# Patient Record
Sex: Male | Born: 1990 | Race: Black or African American | Hispanic: No | Marital: Single | Smoking: Never smoker
Health system: Southern US, Community
[De-identification: ages and names within clinical notes are randomized; demographics above are authoritative.]

## PROBLEM LIST (undated history)

## (undated) DIAGNOSIS — R569 Unspecified convulsions: Secondary | ICD-10-CM

## (undated) DIAGNOSIS — K219 Gastro-esophageal reflux disease without esophagitis: Secondary | ICD-10-CM

## (undated) DIAGNOSIS — E119 Type 2 diabetes mellitus without complications: Secondary | ICD-10-CM

## (undated) HISTORY — PX: NO PAST SURGERIES: SHX2092

---
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
## 2016-04-03 DIAGNOSIS — R569 Unspecified convulsions: Secondary | ICD-10-CM

## 2016-04-03 HISTORY — DX: Unspecified convulsions: R56.9

## 2016-04-24 ENCOUNTER — Emergency Department (HOSPITAL_COMMUNITY): Payer: Managed Care, Other (non HMO)

## 2016-04-24 ENCOUNTER — Encounter (HOSPITAL_COMMUNITY): Payer: Self-pay

## 2016-04-24 ENCOUNTER — Inpatient Hospital Stay (HOSPITAL_COMMUNITY)
Admission: EM | Admit: 2016-04-24 | Discharge: 2016-04-26 | DRG: 638 | Disposition: A | Discharge status: Home / Self Care | Payer: Managed Care, Other (non HMO) | Attending: Internal Medicine | Admitting: Internal Medicine

## 2016-04-24 DIAGNOSIS — Z6841 Body Mass Index (BMI) 40.0 and over, adult: Secondary | ICD-10-CM | POA: Diagnosis not present

## 2016-04-24 DIAGNOSIS — R569 Unspecified convulsions: Secondary | ICD-10-CM | POA: Diagnosis present

## 2016-04-24 DIAGNOSIS — H538 Other visual disturbances: Secondary | ICD-10-CM | POA: Diagnosis present

## 2016-04-24 DIAGNOSIS — I10 Essential (primary) hypertension: Secondary | ICD-10-CM | POA: Diagnosis present

## 2016-04-24 DIAGNOSIS — E1310 Other specified diabetes mellitus with ketoacidosis without coma: Secondary | ICD-10-CM | POA: Diagnosis not present

## 2016-04-24 DIAGNOSIS — E1110 Type 2 diabetes mellitus with ketoacidosis without coma: Principal | ICD-10-CM | POA: Diagnosis present

## 2016-04-24 DIAGNOSIS — R42 Dizziness and giddiness: Secondary | ICD-10-CM | POA: Diagnosis present

## 2016-04-24 DIAGNOSIS — R7989 Other specified abnormal findings of blood chemistry: Secondary | ICD-10-CM | POA: Diagnosis present

## 2016-04-24 DIAGNOSIS — Z833 Family history of diabetes mellitus: Secondary | ICD-10-CM

## 2016-04-24 DIAGNOSIS — E1010 Type 1 diabetes mellitus with ketoacidosis without coma: Secondary | ICD-10-CM | POA: Diagnosis not present

## 2016-04-24 DIAGNOSIS — Z951 Presence of aortocoronary bypass graft: Secondary | ICD-10-CM | POA: Diagnosis not present

## 2016-04-24 DIAGNOSIS — Z8249 Family history of ischemic heart disease and other diseases of the circulatory system: Secondary | ICD-10-CM

## 2016-04-24 DIAGNOSIS — E870 Hyperosmolality and hypernatremia: Secondary | ICD-10-CM | POA: Diagnosis present

## 2016-04-24 HISTORY — DX: Unspecified convulsions: R56.9

## 2016-04-24 HISTORY — DX: Type 2 diabetes mellitus without complications: E11.9

## 2016-04-24 LAB — BASIC METABOLIC PANEL
Anion gap: 15 (ref 5–15)
Anion gap: 10 (ref 5–15)
BUN: 13 mg/dL (ref 6–20)
BUN: 13 mg/dL (ref 6–20)
CALCIUM: 9.5 mg/dL (ref 8.9–10.3)
CHLORIDE: 109 mmol/L (ref 101–111)
CO2: 19 mmol/L — AB (ref 22–32)
CO2: 22 mmol/L (ref 22–32)
CREATININE: 1.57 mg/dL — AB (ref 0.61–1.24)
Calcium: 8.9 mg/dL (ref 8.9–10.3)
Chloride: 100 mmol/L — ABNORMAL LOW (ref 101–111)
Creatinine, Ser: 1.14 mg/dL (ref 0.61–1.24)
GFR calc Af Amer: >60 mL/min (ref >60)
GFR calc Af Amer: >60 mL/min (ref >60)
GFR calc non Af Amer: >60 mL/min (ref >60)
GFR calc non Af Amer: >60 mL/min (ref >60)
GLUCOSE: 739 mg/dL — AB (ref 65–99)
Glucose, Bld: 308 mg/dL — ABNORMAL HIGH (ref 65–99)
POTASSIUM: 4 mmol/L (ref 3.5–5.1)
Potassium: 4.2 mmol/L (ref 3.5–5.1)
SODIUM: 141 mmol/L (ref 135–145)
Sodium: 134 mmol/L — ABNORMAL LOW (ref 135–145)

## 2016-04-24 LAB — I-STAT VENOUS BLOOD GAS, ED
Acid-base deficit: 7 mmol/L — ABNORMAL HIGH (ref 0.0–2.0)
Bicarbonate: 20.8 mmol/L (ref 20.0–28.0)
O2 SAT: 74 %
PCO2 VEN: 47.3 mmHg (ref 44.0–60.0)
PO2 VEN: 45 mmHg (ref 32.0–45.0)
TCO2: 22 mmol/L (ref 0–100)
pH, Ven: 7.251 (ref 7.250–7.430)

## 2016-04-24 LAB — URINALYSIS, ROUTINE W REFLEX MICROSCOPIC
BACTERIA UA: NONE SEEN
Bilirubin Urine: NEGATIVE
Glucose, UA: >=500 mg/dL — AB
Ketones, ur: 20 mg/dL — AB
Leukocytes, UA: NEGATIVE
Nitrite: NEGATIVE
PH: 5 (ref 5.0–8.0)
Protein, ur: NEGATIVE mg/dL
Specific Gravity, Urine: 1.028 (ref 1.005–1.030)

## 2016-04-24 LAB — CBC
HCT: 44.2 % (ref 39.0–52.0)
HCT: 39.8 % (ref 39.0–52.0)
HEMOGLOBIN: 15.4 g/dL (ref 13.0–17.0)
HEMOGLOBIN: 13.8 g/dL (ref 13.0–17.0)
MCH: 28.7 pg (ref 26.0–34.0)
MCH: 28.3 pg (ref 26.0–34.0)
MCHC: 34.8 g/dL (ref 30.0–36.0)
MCHC: 34.7 g/dL (ref 30.0–36.0)
MCV: 82.3 fL (ref 78.0–100.0)
MCV: 81.6 fL (ref 78.0–100.0)
PLATELETS: 204 10*3/uL (ref 150–400)
PLATELETS: 204 10*3/uL (ref 150–400)
RBC: 5.37 MIL/uL (ref 4.22–5.81)
RBC: 4.88 MIL/uL (ref 4.22–5.81)
RDW: 13.2 % (ref 11.5–15.5)
RDW: 13.3 % (ref 11.5–15.5)
WBC: 8.9 10*3/uL (ref 4.0–10.5)
WBC: 11.7 10*3/uL — ABNORMAL HIGH (ref 4.0–10.5)

## 2016-04-24 LAB — MAGNESIUM: MAGNESIUM: 2.3 mg/dL (ref 1.7–2.4)

## 2016-04-24 LAB — CBG MONITORING, ED
Glucose-Capillary: >600 mg/dL (ref 65–99)
Glucose-Capillary: 560 mg/dL (ref 65–99)

## 2016-04-24 LAB — GLUCOSE, CAPILLARY
GLUCOSE-CAPILLARY: 470 mg/dL — AB (ref 65–99)
Glucose-Capillary: 248 mg/dL — ABNORMAL HIGH (ref 65–99)
Glucose-Capillary: 243 mg/dL — ABNORMAL HIGH (ref 65–99)

## 2016-04-24 LAB — MRSA PCR SCREENING: MRSA BY PCR: NEGATIVE

## 2016-04-24 LAB — PHOSPHORUS: PHOSPHORUS: 3.1 mg/dL (ref 2.5–4.6)

## 2016-04-24 LAB — BETA-HYDROXYBUTYRIC ACID: Beta-Hydroxybutyric Acid: 1.59 mmol/L — ABNORMAL HIGH (ref 0.05–0.27)

## 2016-04-24 MED ORDER — SODIUM CHLORIDE 0.9 % IV BOLUS (SEPSIS)
1000.0000 mL | Freq: Once | INTRAVENOUS | Status: AC
  Administered 2016-04-24: 1000 mL via INTRAVENOUS

## 2016-04-24 MED ORDER — DEXTROSE-NACL 5-0.45 % IV SOLN
INTRAVENOUS | Status: DC
  Administered 2016-04-24 – 2016-04-25 (×2): via INTRAVENOUS

## 2016-04-24 MED ORDER — SODIUM CHLORIDE 0.9 % IV SOLN
INTRAVENOUS | Status: DC
  Filled 2016-04-24: qty 2.5

## 2016-04-24 MED ORDER — SODIUM CHLORIDE 0.9 % IV SOLN
INTRAVENOUS | Status: DC
  Administered 2016-04-24: 5.4 [IU]/h via INTRAVENOUS
  Administered 2016-04-24: 10 [IU]/h via INTRAVENOUS
  Filled 2016-04-24: qty 2.5

## 2016-04-24 MED ORDER — SODIUM CHLORIDE 0.9 % IV SOLN: INTRAVENOUS | Status: DC

## 2016-04-24 MED ORDER — SODIUM CHLORIDE 0.9 % IV SOLN
INTRAVENOUS | Status: AC
  Administered 2016-04-24: 22:00:00 via INTRAVENOUS

## 2016-04-24 MED ORDER — ENOXAPARIN SODIUM 40 MG/0.4ML ~~LOC~~ SOLN
40.0000 mg | SUBCUTANEOUS | Status: DC
  Administered 2016-04-24: 40 mg via SUBCUTANEOUS
  Filled 2016-04-24: qty 0.4

## 2016-04-24 MED ORDER — POTASSIUM CHLORIDE CRYS ER 20 MEQ PO TBCR
20.0000 meq | EXTENDED_RELEASE_TABLET | Freq: Two times a day (BID) | ORAL | Status: DC
  Administered 2016-04-24 – 2016-04-26 (×4): 20 meq via ORAL
  Filled 2016-04-24 (×4): qty 1

## 2016-04-24 MED ORDER — FAMOTIDINE 20 MG PO TABS
20.0000 mg | ORAL_TABLET | Freq: Two times a day (BID) | ORAL | Status: DC
  Administered 2016-04-24 – 2016-04-26 (×4): 20 mg via ORAL
  Filled 2016-04-24 (×4): qty 1

## 2016-04-24 NOTE — ED Provider Notes (Signed)
[facility]EMERGENCY DEPT Provider Note  CSN: [id number] Arrival date & time: 04/24/16  1657  History   Chief Complaint Chief Complaint  Patient presents with  . Seizures   HPI Tyler Morris is a 26 y.o. male with no past medical history presenting with 2 episodes of seizures earlier today. History is provided by the patient.   Patient states that he was playing XBOX with his friends this afternoon when he suddenly started seizing. Per the patient, he was having full body convulsions that lasted for a couple of minutes. EMS was called. En route to the hospital patient had another seizure that last for about a minute. He was postictal until arrive to the ED when he regained his normal mental status. Patient endorses frequent urination and polydipsia over the last week or so. Otherwise, no fevers or chills. No recent illnesses. No nausea or vomiting. No headache, weakness, or numbness.   HPI  History reviewed. No pertinent past medical history.  Patient Active Problem List   Diagnosis Date Noted  . DKA (diabetic ketoacidoses) (HCC) 04/24/2016    History reviewed. No pertinent surgical history.     Home Medications    Prior to Admission medications   Medication Sig Start Date End Date Taking? Authorizing Provider  loratadine (CLARITIN) 10 MG tablet Take 10 mg by mouth daily.   Yes Historical Provider, MD    Family History No family history on file.  Social History Social History  Substance Use Topics  . Smoking status: Never Smoker  . Smokeless tobacco: Never Used  . Alcohol use No     Allergies   Patient has no known allergies.   Review of Systems Review of Systems  Constitutional: Negative.   HENT: Negative.   Eyes: Negative.   Respiratory: Negative.   Cardiovascular: Negative.   Gastrointestinal: Negative.   Endocrine: Positive for polydipsia.  Genitourinary: Positive for frequency.  Musculoskeletal: Negative.   Skin: Negative.   Allergic/Immunologic:  Negative.   Neurological: Positive for seizures. Negative for weakness, numbness and headaches.  Hematological: Negative.   Psychiatric/Behavioral: Negative.      Physical Exam Updated Vital Signs BP (!) 146/90   Pulse (!) 114   Temp 98 F (36.7 C) (Oral)   Resp 20   Ht  (1.778 m)   Wt 136.1 kg   SpO2 96%   BMI 43.05 kg/m   Physical Exam  Constitutional: He is oriented to person, place, and time. He appears well-developed and well-nourished. No distress.  HENT:  Head: Normocephalic and atraumatic.  Eyes: EOM are normal. Pupils are equal, round, and reactive to light.  Neck: Normal range of motion. Neck supple.  Cardiovascular: Regular rhythm.  Tachycardia present.   Pulmonary/Chest: Effort normal and breath sounds normal. Tachypnea noted.  Abdominal: Soft. Bowel sounds are normal. He exhibits no distension. There is no tenderness.  Musculoskeletal: Normal range of motion.  Neurological: He is alert and oriented to person, place, and time. He has normal strength. No cranial nerve deficit or sensory deficit.  Skin: Skin is warm and dry.  Psychiatric: He has a normal mood and affect. His behavior is normal. Judgment and thought content normal.  Nursing note and vitals reviewed.    ED Treatments / Results  Labs (all labs ordered are listed, but only abnormal results are displayed) Labs Reviewed  BASIC METABOLIC PANEL - Abnormal; Notable for the following:       Result Value   Sodium 134 (*)    Chloride 100 (*)  CO2 19 (*)    Glucose, Bld 739 (*)    Creatinine, Ser 1.57 (*)    All other components within normal limits  URINALYSIS, ROUTINE W REFLEX MICROSCOPIC - Abnormal; Notable for the following:    Color, Urine COLORLESS (*)    Glucose, UA >=500 (*)    Hgb urine dipstick SMALL (*)    Ketones, ur 20 (*)    Squamous Epithelial / LPF 0-5 (*)    All other components within normal limits  CBG MONITORING, ED - Abnormal; Notable for the following:     Glucose-Capillary >600 (*)    All other components within normal limits  I-STAT VENOUS BLOOD GAS, ED - Abnormal; Notable for the following:    Acid-base deficit 7.0 (*)    All other components within normal limits  CBC  BLOOD GAS, VENOUS  BETA-HYDROXYBUTYRIC ACID    EKG  EKG Interpretation  Date/Time:  Sunday April 24 2016 17:45:35 EDT Ventricular Rate:  123 PR Interval:    QRS Duration: 91 QT Interval:  316 QTC Calculation: 452 R Axis:   94 Text Interpretation:  Sinus tachycardia Borderline right axis deviation Confirmed by ZAVITZ MD, JOSHUA [phone]) on 04/24/2016 5:57:07 PM       Radiology Ct Head Wo Contrast  Result Date: 04/24/2016 CLINICAL DATA:  Seizure today.  Slight headache. EXAM: CT HEAD WITHOUT CONTRAST TECHNIQUE: Contiguous axial images were obtained from the base of the skull through the vertex without intravenous contrast. COMPARISON:  None. FINDINGS: Brain: No evidence of acute infarction, hemorrhage, hydrocephalus, extra-axial collection or mass lesion/mass effect. Vascular: No hyperdense vessel or unexpected calcification. Skull: Normal. Negative for fracture or focal lesion. Sinuses/Orbits: Unremarkable. Other: None. IMPRESSION: Normal examination. Electronically Signed   By: Beckie Salts M.D.   On: 04/24/2016 17:48    Procedures Procedures (including critical care time)  Medications Ordered in ED Medications  sodium chloride 0.9 % bolus 1,000 mL (not administered)  insulin regular (NOVOLIN R,HUMULIN R) 250 Units in sodium chloride 0.9 % 250 mL (1 Units/mL) infusion (5.4 Units/hr Intravenous New Bag/Given 04/24/16 1828)  sodium chloride 0.9 % bolus 1,000 mL (1,000 mLs Intravenous New Bag/Given 04/24/16 1718)     Initial Impression / Assessment and Plan / ED Course  I have reviewed the triage vital signs and the nursing notes.  Pertinent labs & imaging results that were available during my care of the patient were reviewed by me and considered in my medical  decision making (see chart for details).     Patient is an obese 26 year old male with no past medical history presenting with new onset seizure in the setting of tachycardia, tachypnea, and hyperglycemia concerning for DKA. Will check basic labs, VBG, beta hydroxybutyrate, and head CT.  6:02 PM Basic labs notable for profound hyperglycemia (glucose 739), acidosis (bicarb 19), and elevated creatinine. UA with 20 ketones. Head CT negative. Will start IV insulin. Discussed with neurology (Dr. Roxy Manns) who did not feel like the patient needed further evaluation from a neurological standpoint.   6:48 PM Discussed with hospitalist who will admit for new onset DM, and mild DKA.   Final Clinical Impressions(s) / ED Diagnoses   Final diagnoses:  Seizure (HCC)  Diabetic ketoacidosis without coma associated with type 2 diabetes mellitus (HCC)  Seizures (HCC)   New Prescriptions New Prescriptions   No medications on file     Ardith Dark, MD 04/24/16 1849    Blane Ohara, MD 04/24/16 [phone]

## 2016-04-24 NOTE — ED Notes (Signed)
Patient transported to CT 

## 2016-04-24 NOTE — Plan of Care (Signed)
Problem: Education: Goal: Knowledge of disease or condition will improve Outcome: Progressing Talked to patient about basic information regarding hyperglycemia. Pt. and family were eager to learn and verbalized understanding.

## 2016-04-24 NOTE — ED Triage Notes (Signed)
Pt from home with ems c/o new onset seizures.Pt came home fome eating out with friends and friends state "he wasn't acting right" then started having a seizure that lasted about 3 mins.Upon EMS arrival the pt had another siezure that lasted about 1 min.  Pt was postictal for about per EMS. Pt became alert and oriented when pt arrived to the hospital. Pt hypertensive at 210/130 with no history of and CBG read "high"also with no history of. HR 120, on room air, A/ox4. NAd at this time.

## 2016-04-24 NOTE — ED Notes (Signed)
Attempted report x1. 

## 2016-04-24 NOTE — H&P (Signed)
History and Physical    Nishawn Schroth ZOX:[id number] DOB: 04-16-1990 DOA: 04/24/2016  PCP: No PCP Per Patient   Patient coming from: Home.  I have personally briefly reviewed patient's old medical records in [facility]  Chief Complaint: Seizure.  HPI: Tyler Morris is a 26 y.o. male without significant past medical history who is brought to the emergency department via EMS after having a seizure at home.  Per patient, earlier today he went to play Xbox with some friends. About 5 minutes later, he had a generalized seizure. His friends called EMS, who subsequently arrived and reported that, while he was being transported to the emergency department, he had another episode of seizures in the ambulance. There was no tongue bites, urinary or fecal incontinence.  Triage nurse reports that the patient was postictal for about 25 minutes. His initial blood pressure was 210/130 mmHg, heart rate 120 BPM and CBG reading as high. He complains of having a headache and taking loratadine earlier in the day. He also complains of occasional blurred vision, polyuria, polydipsia, dizziness and fatigue for the past 2 weeks. He mentions that he has been drinking a lot of water and occasionally sweet tea. He denies chest pain, palpitations, diaphoresis, PND, orthopnea or pitting edema of the lower extremities. She denies abdominal pain, nausea, emesis, diarrhea, constipation, melena or hematochezia. He denies dysuria or hematuria.   ED Course: The patient received 2000 and ML of normal saline bolus and was started on an insulin infusion. Neurology was consulted. His urine analysis showed mild hemoglobinuria and 20 mg/dL of ketones. WBC 8.9, hemoglobin 15.4 g/dL and platelets 811. Sodium 134, potassium 4.2, chloride 100, bicarbonate 19 mmol/L. His BUN is 13, creatinine 1.57 and glucose 739 mg/dL. A venous blood gas showed an acid base deficit of 7, the rest of the values are within normal limits. CT scan of the head  was within normal limits.  Review of Systems: As per HPI otherwise 10 point review of systems negative.    Past Medical History:  Diagnosis Date  . Diabetes mellitus without complication (HCC)   . Seizures (HCC)     History reviewed. No pertinent surgical history.   reports that he has never smoked. He has never used smokeless tobacco. He reports that he does not drink alcohol or use drugs.  No Known Allergies  Family History  Problem Relation Age of Onset  . Hypertension Mother   . Diabetes Maternal Grandfather   . Hypertension Maternal Grandfather   . Hypertension Maternal Grandmother   . Hyperlipidemia Maternal Grandmother     Prior to Admission medications   Medication Sig Start Date End Date Taking? Authorizing Provider  loratadine (CLARITIN) 10 MG tablet Take 10 mg by mouth daily.   Yes Historical Provider, MD    Physical Exam:  Constitutional: NAD, calm, comfortable Vitals:   04/24/16 1730 04/24/16 1836 04/24/16 1927 04/24/16 1930  BP: (!) 171/110 (!) 146/90 (!) 154/106 132/75  Pulse: (!) 122 (!) 114 (!) 112 (!) 109  Resp: (!) Temp:      TempSrc:      SpO2: 94% 96% 96% 97%  Weight:      Height:       Eyes: PERRL, lids and conjunctivae normal ENMT: Mucous membranes are mildly dry. Posterior pharynx clear of any exudate or lesions. Neck: normal, supple, no masses, no thyromegaly Respiratory: clear to auscultation bilaterally, no wheezing, no crackles. Normal respiratory effort. No accessory muscle use.  Cardiovascular:  Tachycardic at 109 BPM, no murmurs / rubs / gallops. No extremity edema. 2+ pedal pulses. No carotid bruits.  Abdomen: Obese, soft, no tenderness, no masses palpated. No hepatosplenomegaly. Bowel sounds positive.  Musculoskeletal: no clubbing / cyanosis.  Good ROM, no contractures. Normal muscle tone.  Skin: no rashes, lesions, ulcers. No induration Neurologic: CN 2-12 grossly intact. Sensation intact, DTR normal. Strength 5/5 in all  4.  Psychiatric: Normal judgment and insight. Alert and oriented x 3. Normal mood.    Labs on Admission: I have personally reviewed following labs and imaging studies  CBC:  Recent Labs Lab 04/24/16 1712  WBC 8.9  HGB 15.4  HCT 44.2  MCV 82.3  PLT 204   Basic Metabolic Panel:  Recent Labs Lab 04/24/16 1712  NA 134*  K 4.2  CL 100*  CO2 19*  GLUCOSE 739*  BUN 13  CREATININE 1.57*  CALCIUM 9.5   GFR: Estimated Creatinine Clearance (by C-G formula based on SCr of 1.57 mg/dL (H)) Male: 84 mL/min (A) Male: 101.6 mL/min (A) Liver Function Tests: No results for input(s): AST, ALT, ALKPHOS, BILITOT, PROT, ALBUMIN in the last 168 hours. No results for input(s): LIPASE, AMYLASE in the last 168 hours. No results for input(s): AMMONIA in the last 168 hours. Coagulation Profile: No results for input(s): INR, PROTIME in the last 168 hours. Cardiac Enzymes: No results for input(s): CKTOTAL, CKMB, CKMBINDEX, TROPONINI in the last 168 hours. BNP (last 3 results) No results for input(s): PROBNP in the last 8760 hours. HbA1C: No results for input(s): HGBA1C in the last 72 hours. CBG:  Recent Labs Lab 04/24/16 1712 04/24/16 1947  GLUCAP >600* 560*   Lipid Profile: No results for input(s): CHOL, HDL, LDLCALC, TRIG, CHOLHDL, LDLDIRECT in the last 72 hours. Thyroid Function Tests: No results for input(s): TSH, T4TOTAL, FREET4, T3FREE, THYROIDAB in the last 72 hours. Anemia Panel: No results for input(s): VITAMINB12, FOLATE, FERRITIN, TIBC, IRON, RETICCTPCT in the last 72 hours. Urine analysis:    Component Value Date/Time   COLORURINE COLORLESS (A) 04/24/2016 1754   APPEARANCEUR CLEAR 04/24/2016 1754   LABSPEC 1.028 04/24/2016 1754   PHURINE 5.0 04/24/2016 1754   GLUCOSEU >=500 (A) 04/24/2016 1754   HGBUR SMALL (A) 04/24/2016 1754   BILIRUBINUR NEGATIVE 04/24/2016 1754   KETONESUR 20 (A) 04/24/2016 1754   PROTEINUR NEGATIVE 04/24/2016 1754   NITRITE NEGATIVE  04/24/2016 1754   LEUKOCYTESUR NEGATIVE 04/24/2016 1754    Radiological Exams on Admission: Ct Head Wo Contrast  Result Date: 04/24/2016 CLINICAL DATA:  Seizure today.  Slight headache. EXAM: CT HEAD WITHOUT CONTRAST TECHNIQUE: Contiguous axial images were obtained from the base of the skull through the vertex without intravenous contrast. COMPARISON:  None. FINDINGS: Brain: No evidence of acute infarction, hemorrhage, hydrocephalus, extra-axial collection or mass lesion/mass effect. Vascular: No hyperdense vessel or unexpected calcification. Skull: Normal. Negative for fracture or focal lesion. Sinuses/Orbits: Unremarkable. Other: None. IMPRESSION: Normal examination. Electronically Signed   By: Beckie Salts M.D.   On: 04/24/2016 17:48    EKG: Independently reviewed. Vent. rate 123 BPM PR interval * ms QRS duration 91 ms QT/QTc 316/452 ms P-R-T axes 58 94 12 Sinus tachycardia Borderline right axis deviation  Assessment/Plan Principal Problem:   DKA (diabetic ketoacidoses) (HCC) Admit to stepdown/inpatient. Continue IV fluids. Continue insulin infusion. Monitor CBG hourly. Keep nothing by mouth for now. Monitor potassium, phosphorus and magnesium. Check hemoglobin A1c in a.m.  Active Problems:   Seizures (HCC) CT scan of the brain was  negative. Neuro checks every 4 hours. Further workup per neurology.    Elevated serum creatinine Continue IV fluids. Follow-up renal function and electrolytes.    DVT prophylaxis: Lovenox SQ. Code Status: Full code. Family Communication: His mother and sister were present in the emergency department. Disposition Plan: Admit for DKA treatment and neurology evaluation. Consults called: Neurology was consulted by the emergency department, consult report still pending. Admission status: Inpatient/stepdown.   Bobette Mo MD [facility] Pager [phone]  If 7PM-7AM, please contact night-coverage [email or web address] Password  [facility]  04/24/2016, 8:45 PM

## 2016-04-24 NOTE — ED Notes (Signed)
Pt returned from CT °

## 2016-04-25 DIAGNOSIS — E1310 Other specified diabetes mellitus with ketoacidosis without coma: Secondary | ICD-10-CM

## 2016-04-25 DIAGNOSIS — R7989 Other specified abnormal findings of blood chemistry: Secondary | ICD-10-CM

## 2016-04-25 LAB — BASIC METABOLIC PANEL
ANION GAP: 9 (ref 5–15)
Anion gap: 8 (ref 5–15)
Anion gap: 9 (ref 5–15)
BUN: 10 mg/dL (ref 6–20)
BUN: 11 mg/dL (ref 6–20)
BUN: 12 mg/dL (ref 6–20)
CHLORIDE: 110 mmol/L (ref 101–111)
CO2: 21 mmol/L — ABNORMAL LOW (ref 22–32)
CO2: 22 mmol/L (ref 22–32)
CO2: 19 mmol/L — AB (ref 22–32)
CREATININE: 1.05 mg/dL (ref 0.61–1.24)
Calcium: 8.5 mg/dL — ABNORMAL LOW (ref 8.9–10.3)
Calcium: 8.6 mg/dL — ABNORMAL LOW (ref 8.9–10.3)
Calcium: 8.5 mg/dL — ABNORMAL LOW (ref 8.9–10.3)
Chloride: 112 mmol/L — ABNORMAL HIGH (ref 101–111)
Chloride: 113 mmol/L — ABNORMAL HIGH (ref 101–111)
Creatinine, Ser: 0.97 mg/dL (ref 0.61–1.24)
Creatinine, Ser: 0.86 mg/dL (ref 0.61–1.24)
GFR calc Af Amer: >60 mL/min (ref >60)
GFR calc non Af Amer: >60 mL/min (ref >60)
GLUCOSE: 301 mg/dL — AB (ref 65–99)
Glucose, Bld: 241 mg/dL — ABNORMAL HIGH (ref 65–99)
Glucose, Bld: 150 mg/dL — ABNORMAL HIGH (ref 65–99)
POTASSIUM: 3.4 mmol/L — AB (ref 3.5–5.1)
POTASSIUM: 4 mmol/L (ref 3.5–5.1)
Potassium: 3.9 mmol/L (ref 3.5–5.1)
SODIUM: 138 mmol/L (ref 135–145)
SODIUM: 142 mmol/L (ref 135–145)
Sodium: 143 mmol/L (ref 135–145)

## 2016-04-25 LAB — GLUCOSE, CAPILLARY
GLUCOSE-CAPILLARY: 220 mg/dL — AB (ref 65–99)
GLUCOSE-CAPILLARY: 162 mg/dL — AB (ref 65–99)
GLUCOSE-CAPILLARY: 159 mg/dL — AB (ref 65–99)
GLUCOSE-CAPILLARY: 165 mg/dL — AB (ref 65–99)
GLUCOSE-CAPILLARY: 168 mg/dL — AB (ref 65–99)
Glucose-Capillary: 229 mg/dL — ABNORMAL HIGH (ref 65–99)
Glucose-Capillary: 173 mg/dL — ABNORMAL HIGH (ref 65–99)
Glucose-Capillary: 165 mg/dL — ABNORMAL HIGH (ref 65–99)
Glucose-Capillary: 294 mg/dL — ABNORMAL HIGH (ref 65–99)
Glucose-Capillary: 248 mg/dL — ABNORMAL HIGH (ref 65–99)
Glucose-Capillary: 247 mg/dL — ABNORMAL HIGH (ref 65–99)
Glucose-Capillary: 223 mg/dL — ABNORMAL HIGH (ref 65–99)

## 2016-04-25 LAB — CBC WITH DIFFERENTIAL/PLATELET
BASOS ABS: 0 10*3/uL (ref 0.0–0.1)
Basophils Relative: 0 %
EOS ABS: 0.1 10*3/uL (ref 0.0–0.7)
EOS PCT: 2 %
HCT: 37.1 % — ABNORMAL LOW (ref 39.0–52.0)
Hemoglobin: 12.5 g/dL — ABNORMAL LOW (ref 13.0–17.0)
LYMPHS PCT: 27 %
Lymphs Abs: 2.2 10*3/uL (ref 0.7–4.0)
MCH: 27.7 pg (ref 26.0–34.0)
MCHC: 33.7 g/dL (ref 30.0–36.0)
MCV: 82.1 fL (ref 78.0–100.0)
Monocytes Absolute: 0.6 10*3/uL (ref 0.1–1.0)
Monocytes Relative: 8 %
NEUTROS PCT: 63 %
Neutro Abs: 5.1 10*3/uL (ref 1.7–7.7)
PLATELETS: 171 10*3/uL (ref 150–400)
RBC: 4.52 MIL/uL (ref 4.22–5.81)
RDW: 13.4 % (ref 11.5–15.5)
WBC: 8 10*3/uL (ref 4.0–10.5)

## 2016-04-25 LAB — HEMOGLOBIN A1C
HEMOGLOBIN A1C: 11.2 % — AB (ref 4.8–5.6)
Mean Plasma Glucose: 275 mg/dL

## 2016-04-25 LAB — HIV ANTIBODY (ROUTINE TESTING W REFLEX): HIV SCREEN 4TH GENERATION: NONREACTIVE

## 2016-04-25 MED ORDER — GLIPIZIDE 5 MG PO TABS
5.0000 mg | ORAL_TABLET | Freq: Two times a day (BID) | ORAL | Status: DC
  Administered 2016-04-25 – 2016-04-26 (×2): 5 mg via ORAL
  Filled 2016-04-25 (×2): qty 1

## 2016-04-25 MED ORDER — SODIUM CHLORIDE 0.9 % IV SOLN
INTRAVENOUS | Status: DC
  Administered 2016-04-25: 09:00:00 via INTRAVENOUS

## 2016-04-25 MED ORDER — INSULIN GLARGINE 100 UNIT/ML ~~LOC~~ SOLN
5.0000 [IU] | Freq: Every day | SUBCUTANEOUS | Status: DC
  Administered 2016-04-25: 5 [IU] via SUBCUTANEOUS
  Filled 2016-04-25: qty 0.05

## 2016-04-25 MED ORDER — HYDROCHLOROTHIAZIDE 12.5 MG PO CAPS
12.5000 mg | ORAL_CAPSULE | Freq: Every day | ORAL | Status: DC
  Administered 2016-04-26: 12.5 mg via ORAL
  Filled 2016-04-25: qty 1

## 2016-04-25 MED ORDER — LISINOPRIL 5 MG PO TABS
5.0000 mg | ORAL_TABLET | Freq: Every day | ORAL | Status: DC
  Administered 2016-04-26: 5 mg via ORAL
  Filled 2016-04-25: qty 1

## 2016-04-25 MED ORDER — LIVING WELL WITH DIABETES BOOK
Freq: Once | Status: AC
  Administered 2016-04-25: 12:00:00
  Filled 2016-04-25: qty 1

## 2016-04-25 MED ORDER — INSULIN ASPART 100 UNIT/ML ~~LOC~~ SOLN
0.0000 [IU] | Freq: Three times a day (TID) | SUBCUTANEOUS | Status: DC
  Administered 2016-04-26: 3 [IU] via SUBCUTANEOUS
  Administered 2016-04-26: 5 [IU] via SUBCUTANEOUS
  Administered 2016-04-26: 8 [IU] via SUBCUTANEOUS

## 2016-04-25 MED ORDER — INSULIN ASPART 100 UNIT/ML ~~LOC~~ SOLN
0.0000 [IU] | Freq: Every day | SUBCUTANEOUS | Status: DC
  Administered 2016-04-25: 2 [IU] via SUBCUTANEOUS

## 2016-04-25 MED ORDER — INSULIN ASPART 100 UNIT/ML ~~LOC~~ SOLN: 0.0000 [IU] | Freq: Every day | SUBCUTANEOUS | Status: DC

## 2016-04-25 MED ORDER — INSULIN ASPART 100 UNIT/ML ~~LOC~~ SOLN
0.0000 [IU] | Freq: Three times a day (TID) | SUBCUTANEOUS | Status: DC
  Administered 2016-04-25: 5 [IU] via SUBCUTANEOUS
  Administered 2016-04-25: 3 [IU] via SUBCUTANEOUS

## 2016-04-25 MED ORDER — ENOXAPARIN SODIUM 80 MG/0.8ML ~~LOC~~ SOLN
80.0000 mg | SUBCUTANEOUS | Status: DC
  Administered 2016-04-25: 80 mg via SUBCUTANEOUS
  Filled 2016-04-25: qty 0.8

## 2016-04-25 MED ORDER — METFORMIN HCL 500 MG PO TABS
500.0000 mg | ORAL_TABLET | Freq: Two times a day (BID) | ORAL | Status: DC
  Administered 2016-04-25: 500 mg via ORAL
  Filled 2016-04-25: qty 1

## 2016-04-25 NOTE — Progress Notes (Signed)
Inpatient Diabetes Program Recommendations  AACE/ADA: New Consensus Statement on Inpatient Glycemic Control (2015)  Target Ranges:  Prepandial:   less than 140 mg/dL      Peak postprandial:   less than 180 mg/dL (1-2 hours)      Critically ill patients:  140 - 180 mg/dL   Results for JASN, XIA (MRN [id number]) as of 04/25/2016 15:40  Ref. Range 04/24/2016 17:12 04/24/2016 19:47 04/24/2016 21:04 04/24/2016 22:20 04/24/2016 23:26 04/25/2016 00:34 04/25/2016 01:38 04/25/2016 02:38 04/25/2016 03:50 04/25/2016 04:56 04/25/2016 05:55 04/25/2016 07:12 04/25/2016 08:18 04/25/2016 12:04  Glucose-Capillary Latest Ref Range: 65 - 99 mg/dL >621 (HH) 308 (HH) 657 (H) 248 (H) 243 (H) 229 (H) 220 (H) 162 (H) 159 (H) 165 (H) 168 (H) 173 (H) 165 (H) 294 (H)    Admit with: Seizures/ New diagnosis of DM  Current Insulin Orders: Lantus 5 units daily      Novolog Sensitive Correction Scale/ SSI (0-9 units) TID AC + HS      Spoke with pt and mother of pt about new diagnosis.  Hemoglobin A1C in process.  Explained what an A1C is, basic pathophysiology of DM Type 2, basic home care, basic diabetes diet nutrition principles, importance of checking CBGs and maintaining good CBG control to prevent long-term and short-term complications.  Reviewed signs and symptoms of hyperglycemia and hypoglycemia and how to treat hypoglycemia at home.  Also reviewed blood sugar goals and A1c goals for home.    RNs to provide ongoing basic DM education at bedside with this patient.  Have ordered educational booklet, CBG meter teaching, and DM videos.  Have also placed RD consult for DM diet education for this patient.  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  Does not get much exercise.  Stated he does drink a sweet tea at lunch but mostly drinks water.  Discussed basic DM diet information with patient.  Encouraged patient to avoid beverages with sugar (regular soda, sweet tea, lemonade, fruit juice) and to consume mostly  water.  Discussed what foods contain carbohydrates and how carbohydrates affect the body's blood sugar levels.  Encouraged patient to be careful with his portion sizes (especially grains, starchy vegetables, and fruits).      MD- Note we are currently waiting on pt's A1c results.  I am hopeful that pt will be able to be discharged home on an oral medication regimen along with nutritional/lifestyle modifications to help him manage his diabetes.  I am not sure pt can still drive a truck for his job if he is taking insulin.  At the very least, pt would likely benefit from starting Metformin at home.     --Will follow patient during hospitalization--  Ambrose Finland RN, MSN, CDE Diabetes Coordinator Inpatient Glycemic Control Team Team Pager: [phone] (8a-5p)

## 2016-04-25 NOTE — Plan of Care (Signed)
Problem: Nutrition: Goal: Adequate nutrition will be maintained Outcome: Progressing Discussed with the pt. The importance of eating healthy, low carb foods to keep blood sugar within range.

## 2016-04-25 NOTE — Care Management Note (Addendum)
Case Management Note  Patient Details  Name: Abass Misener MRN: [id number] Date of Birth: 25-Jun-1990  Subjective/Objective:  From home , presents with Seizures secondary to DKA, patient states he has insurance with [employer] and he has medication coverage, he does not have a pcp, he would like to follow with a  MD, will check into tomorrow.  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   His subscriber number is [id number], group number [id number], BIN [id number], PCN [id number].                  Action/Plan: NCM will follow for dc needs.   Expected Discharge Date:                  Expected Discharge Plan:  Home/Self Care  In-House Referral:     Discharge planning Services  CM Consult, Medication Assistance, Indigent Health Clinic  Post Acute Care Choice:    Choice offered to:     DME Arranged:    DME Agency:     HH Arranged:    HH Agency:     Status of Service:  In process, will continue to follow  If discussed at Long Length of Stay Meetings, dates discussed:    Additional Comments:  Leone Haven, RN 04/25/2016, 5:54 PM

## 2016-04-25 NOTE — Progress Notes (Signed)
[place] TEAM 1 - Stepdown/ICU TEAM  Craig Drucie Opitz  UJW:[id number] DOB: 01-06-1990 DOA: 04/24/2016 PCP: No PCP Per Patient    Brief Narrative:  26 y.o. male without significant past medical history who was brought to the emergency department via EMS after having a generalized seizure at home.  His friends called EMS, who subsequently arrived and reported that while he was being transported he had another episode of seizures in the ambulance. There was no tongue bites, urinary or fecal incontinence.  He complained of occasional blurred vision, polyuria, polydipsia, dizziness and fatigue for 2 weeks. His serum glucose was >700 upon arrival.    Subjective: The patient is resting comfortably in bed.  He denies chest pain shortness breath fevers chills nausea vomiting or abdominal pain.  Assessment & Plan:  Early DKA in newly diagnosed diabetes Now off insulin gtt, but CBG remains quite variable and is presently climbing despite low dose lantus given at transition - attempting to avoid home insulin but may be required - begin glucophage and glucotrol tonight - cont SSI - home when education completed and CBG consistently <200 - extensive discussion on diagnosis of DM w/ pt and his mother at bedside   Seizure Likely due to hyperosmolar state - will not start medical tx at this time but follow - if recurrs will require evaluation - CT brain unrevealing   Elevated BP May or may not have HTN, but likely does - will initiate HCTZ and ACEi given his DM, and advised him that outpt f/u of his BP when he is not acutely ill will be necessary before a formal diagnosis of HTN is made  Elevated serum creatinine Due to DKA/pre-renal state - resolved w/ volume expansion   Recent Labs Lab 04/24/16 1712 04/24/16 2156 04/25/16 0030 04/25/16 0457 04/25/16 1220  CREATININE 1.57* 1.14 0.97 0.86 1.05    Morbid obesity - Body mass index is 54.53 kg/m. Counseled patient only between obesity and his diabetes  and the fact that his CBG will be much easier to control if he loses weight and that his diabetes may not require medication if he reaches his target BMI  DVT prophylaxis: lovenox  Code Status: FULL CODE Family Communication: Spoke with patient and mother at bedside at length Disposition Plan: Transfer to medical bed - continue extensive diabetes education - follow CBGs - discharge home when education completed and CBG consistently <200  Consultants:  None  Procedures: none  Antimicrobials:  none   Objective: Blood pressure (!) 153/88, pulse 90, temperature 98.3 F (36.8 C), temperature source Oral, resp. rate 15, height  (1.753 m), weight (!) 167.5 kg (369 lb 4.3 oz), SpO2 96 %.  Intake/Output Summary (Last 24 hours) at 04/25/16 1614 Last data filed at 04/25/16 1500  Gross per 24 hour  Intake          5308.29 ml  Output             3600 ml  Net          1708.29 ml   Filed Weights   04/24/16 1703 04/24/16 2047  Weight: 136.1 kg (300 lb) (!) 167.5 kg (369 lb 4.3 oz)    Examination: General: No acute respiratory distress Lungs: Clear to auscultation bilaterally without wheezes or crackles Cardiovascular: Regular rate and rhythm without murmur gallop or rub normal S1 and S2 Abdomen: Morbidly obese, soft, bowel sounds positive, no rebound Extremities: No significant cyanosis, clubbing, or edema bilateral lower extremities  CBC:  Recent Labs  Lab 04/24/16 1712 04/24/16 2156 04/25/16 0457  WBC 8.9 11.7* 8.0  NEUTROABS  --   --  5.1  HGB 15.4 13.8 12.5*  HCT 44.2 39.8 37.1*  MCV 82.3 81.6 82.1  PLT 204 204 171   Basic Metabolic Panel:  Recent Labs Lab 04/24/16 1712 04/24/16 2156 04/25/16 0030 04/25/16 0457 04/25/16 1220  NA 134* 141 142 143 138  K 4.2 4.0 4.0 3.4* 3.9  CL 100* 109 112* 113* 110  CO2 19* 22 21* 22 19*  GLUCOSE 739* 308* 241* 150* 301*  BUN CREATININE 1.57* 1.14 0.97 0.86 1.05  CALCIUM 9.5 8.9 8.5* 8.6* 8.5*  MG  --   2.3  --   --   --   PHOS  --  3.1  --   --   --    GFR: Estimated Creatinine Clearance: 169.3 mL/min (by C-G formula based on SCr of 1.05 mg/dL).  HbA1C: No results found for: HGBA1C  CBG:  Recent Labs Lab 04/25/16 0456 04/25/16 0555 04/25/16 0712 04/25/16 0818 04/25/16 1204  GLUCAP 165* 168* 173* 165* 294*    Recent Results (from the past 240 hour(s))  MRSA PCR Screening     Status: None   Collection Time: 04/24/16  8:42 PM  Result Value Ref Range Status   MRSA by PCR NEGATIVE NEGATIVE Final    Comment:        The GeneXpert MRSA Assay (FDA approved for NASAL specimens only), is one component of a comprehensive MRSA colonization surveillance program. It is not intended to diagnose MRSA infection nor to guide or monitor treatment for MRSA infections.      Scheduled Meds: . enoxaparin (LOVENOX) injection  40 mg Subcutaneous Q24H  . famotidine  20 mg Oral BID  . insulin aspart  0-5 Units Subcutaneous QHS  . insulin aspart  0-9 Units Subcutaneous TID WC  . insulin glargine  5 Units Subcutaneous Daily  . potassium chloride  20 mEq Oral BID   Continuous Infusions: . sodium chloride 50 mL/hr at 04/25/16 0902     LOS: 1 day   Lonia Blood, MD [facility] Office  [phone] Pager - Text Page per Loretha Stapler as per below:  On-Call/Text Page:      [email or web address]      password [email or web address]  If 7PM-7AM, please contact night-coverage [email or web address] Password [facility] 04/25/2016, 4:14 PM

## 2016-04-25 NOTE — Progress Notes (Signed)
Nutrition Education Note  RD consulted for nutrition education regarding diabetes.   RD provided "Nutrition Therapy for Type II Diabetes" handout from the Academy of Nutrition and Dietetics. Discussed different food groups and their effects on blood sugar, emphasizing carbohydrate-containing foods. Provided list of carbohydrates and recommended serving sizes of common foods.  Discussed importance of controlled and consistent carbohydrate intake throughout the day. Provided examples of ways to balance meals/snacks and encouraged intake of high-fiber, whole grain complex carbohydrates. Teach back method used.  Expect good compliance.  Body mass index is 54.53 kg/m. Pt meets criteria for morbid obese based on current BMI.  Current diet order is carbohydrate controlled, patient is consuming approximately 100% of meals at this time. Labs and medications reviewed. No further nutrition interventions warranted at this time. RD contact information provided. If additional nutrition issues arise, please re-consult RD.  Tyler Morris, RD, LDN Pager #- [phone]

## 2016-04-25 NOTE — Plan of Care (Signed)
Problem: Education: Goal: Knowledge of disease or condition will improve Outcome: Progressing Patient given/educated on contents of living with diabetes booklet. Patient watching diabetes videos. Family at bedside educated as well.

## 2016-04-26 ENCOUNTER — Inpatient Hospital Stay (HOSPITAL_COMMUNITY): Payer: Managed Care, Other (non HMO)

## 2016-04-26 DIAGNOSIS — R569 Unspecified convulsions: Secondary | ICD-10-CM

## 2016-04-26 LAB — RAPID URINE DRUG SCREEN, HOSP PERFORMED
Amphetamines: NOT DETECTED
Barbiturates: NOT DETECTED
Benzodiazepines: NOT DETECTED
COCAINE: NOT DETECTED
OPIATES: NOT DETECTED
TETRAHYDROCANNABINOL: NOT DETECTED

## 2016-04-26 LAB — BASIC METABOLIC PANEL
Anion gap: 9 (ref 5–15)
BUN: 9 mg/dL (ref 6–20)
CHLORIDE: 103 mmol/L (ref 101–111)
CO2: 22 mmol/L (ref 22–32)
CREATININE: 0.84 mg/dL (ref 0.61–1.24)
Calcium: 9.4 mg/dL (ref 8.9–10.3)
GFR calc non Af Amer: >60 mL/min (ref >60)
Glucose, Bld: 241 mg/dL — ABNORMAL HIGH (ref 65–99)
POTASSIUM: 3.6 mmol/L (ref 3.5–5.1)
Sodium: 134 mmol/L — ABNORMAL LOW (ref 135–145)

## 2016-04-26 LAB — GLUCOSE, CAPILLARY
GLUCOSE-CAPILLARY: 195 mg/dL — AB (ref 65–99)
GLUCOSE-CAPILLARY: 209 mg/dL — AB (ref 65–99)
Glucose-Capillary: 256 mg/dL — ABNORMAL HIGH (ref 65–99)

## 2016-04-26 MED ORDER — LISINOPRIL 5 MG PO TABS: 5.0000 mg | ORAL_TABLET | Freq: Every day | ORAL | 0 refills | Status: DC

## 2016-04-26 MED ORDER — BLOOD GLUCOSE METER KIT: PACK | 0 refills | Status: DC

## 2016-04-26 MED ORDER — GLIPIZIDE 5 MG PO TABS
10.0000 mg | ORAL_TABLET | Freq: Two times a day (BID) | ORAL | Status: DC
  Administered 2016-04-26: 10 mg via ORAL
  Filled 2016-04-26: qty 2

## 2016-04-26 MED ORDER — METFORMIN HCL 1000 MG PO TABS: 1000.0000 mg | ORAL_TABLET | Freq: Two times a day (BID) | ORAL | 0 refills | Status: DC

## 2016-04-26 MED ORDER — GLIPIZIDE 10 MG PO TABS: 10.0000 mg | ORAL_TABLET | Freq: Two times a day (BID) | ORAL | 0 refills | Status: DC

## 2016-04-26 MED ORDER — METFORMIN HCL 500 MG PO TABS
1000.0000 mg | ORAL_TABLET | Freq: Two times a day (BID) | ORAL | Status: DC
  Administered 2016-04-26 (×2): 1000 mg via ORAL
  Filled 2016-04-26 (×2): qty 2

## 2016-04-26 MED ORDER — HYDROCHLOROTHIAZIDE 12.5 MG PO CAPS: 12.5000 mg | ORAL_CAPSULE | Freq: Every day | ORAL | 0 refills | Status: DC

## 2016-04-26 NOTE — Progress Notes (Signed)
Discharge instructions, RX's and follow up appts explained and provide to patient verbalized understanding. Patient left floor via wheelchair accompanied by staff no c/o pain or shortness of breath at discharge.  Muaaz Brau, Kae Heller, RN

## 2016-04-26 NOTE — Progress Notes (Signed)
Follow-up visit made with patient regarding new diagnosis of diabetes.  Patient states that MD has told him that he will be discharged on pills for diabetes at d/c and hopes to avoid insulin.  Gave patient handout regarding A1C results (11.2%), normal glucose values and goals/standards of care.  Patient appreciative of information.  He may benefit from Endocrinology consult as outpatient as well? Upon d/c patient will need Rx. For CBG meter ([id number]) as well.  Will need follow up with PCP asap.  Patient appreciative of information.   Thanks, Beryl Meager, RN, BC-ADM Inpatient Diabetes Coordinator Pager [phone] (8a-5p)

## 2016-04-26 NOTE — Progress Notes (Signed)
Routine adult EEG completed, results pending. 

## 2016-04-26 NOTE — Discharge Summary (Addendum)
Tyler Morris, is a 26 y.o. male  DOB 1990/12/10  MRN [id number].  Admission date:  04/24/2016  Admitting Physician  Reubin Milan, MD  Discharge Date:  04/26/2016   Primary MD  No PCP Per Patient  Recommendations for primary care physician for things to follow:  - Please check CBC, BMP during this visit, continue CBG monitoring and adjust oral hypoglycemic regimen as needed   Admission Diagnosis  Seizure ([place]) [R56.9] Seizures ([place]) [R56.9] Diabetic ketoacidosis without coma associated with type 2 diabetes mellitus ([place]) [E11.10]   Discharge Diagnosis  Seizure ([place]) [R56.9] Seizures ([place]) [R56.9] Diabetic ketoacidosis without coma associated with type 2 diabetes mellitus ([place]) [E11.10]    Principal Problem:   DKA (diabetic ketoacidoses) ([place]) Active Problems:   Seizures ([place])   Elevated serum creatinine   Seizure ([place])      Past Medical History:  Diagnosis Date  . Diabetes mellitus without complication ([place])   . Seizures ([place])     History reviewed. No pertinent surgical history.     History of present illness and  Hospital Course:     Kindly see H&P for history of present illness and admission details, please review complete Labs, Consult reports and Test reports for all details in brief  HPI  from the history and physical done on the day of admission 04/24/2016 HPI: Tyler Morris is a 26 y.o. male without significant past medical history who is brought to the emergency department via EMS after having a seizure at home.  Per patient, earlier today he went to play Xbox with some friends. About 5 minutes later, he had a generalized seizure. His friends called EMS, who subsequently arrived and reported that, while he was being transported to the emergency department, he had another episode of seizures in the ambulance. There was no tongue bites, urinary or fecal incontinence.  Triage  nurse reports that the patient was postictal for about 25 minutes. His initial blood pressure was 210/130 mmHg, heart rate 120 BPM and CBG reading as high. He complains of having a headache and taking loratadine earlier in the day. He also complains of occasional blurred vision, polyuria, polydipsia, dizziness and fatigue for the past 2 weeks. He mentions that he has been drinking a lot of water and occasionally sweet tea. He denies chest pain, palpitations, diaphoresis, PND, orthopnea or pitting edema of the lower extremities. She denies abdominal pain, nausea, emesis, diarrhea, constipation, melena or hematochezia. He denies dysuria or hematuria.   ED Course: The patient received 2000 and ML of normal saline bolus and was started on an insulin infusion. Neurology was consulted. His urine analysis showed mild hemoglobinuria and 20 mg/dL of ketones. WBC 8.9, hemoglobin 15.4 g/dL and platelets 204. Sodium 134, potassium 4.2, chloride 100, bicarbonate 19 mmol/L. His BUN is 13, creatinine 1.57 and glucose 739 mg/dL. A venous blood gas showed an acid base deficit of 7, the rest of the values are within normal limits. CT scan of the head was within normal limits.   Hospital Course  25 y.o.malewithout significant past medical history who was brought to the emergency department via EMS after having a generalized seizure at home.  His friends called EMS, who subsequently arrived and reported that while he was being transported he had another episode of seizures in the ambulance. There was no tongue bites, urinary or fecal incontinence.  He complained of occasional blurred vision, polyuria, polydipsia, dizziness and fatigue for 2 weeks. His serum glucose was >700 upon arrival.     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 likely   Seizure Neurology input greatly appreciated, awake EEG is normal, seizure most likely provoked by hyperglycemia, so no indication to start antiepileptic medication, as well no further workup at this time, and no need for driving prohibition as long hyperglycemia/Hypoglycemia can be avoided reliably, patient understand compliance with diet and medication is essential in preventing further hyperglycemic episodes which provoked his seizure.  Hypertension  - Better controlled after starting hydrochlorothiazide and lisinopril   Elevated serum creatinine Due to DKA/pre-renal state - resolved w/ volume expansion    Discharge Condition:  stable   Follow UP  Follow-up Information    [facility]. Go on 05/03/2016.   Why:  post hospital follow up scheduled 05/03/2016 at 2pm with Cammie Sickle NP Contact information: [place] [id number]            Discharge Instructions  and  Discharge Medications     Discharge Instructions    Ambulatory referral to Nutrition and Diabetic Education    Complete by:  As directed    Discharge instructions    Complete by:  As directed    Follow with Primary MD  in 7 days   Get CBC, CMP,checked  by Primary MD next visit.    Activity: As tolerated with Full fall precautions use walker/cane & assistance as needed   Disposition Home   Diet: Carbohydrate modified, heart healthy diet   On your next visit with your primary care physician please Get Medicines reviewed and adjusted.   Please request your Prim.MD to go over all Hospital Tests and Procedure/Radiological results at the follow up, please get all Hospital records sent to your Prim MD by signing hospital release before you go  home.   If you experience worsening of your admission symptoms, develop shortness of breath, life threatening emergency, suicidal or homicidal thoughts you must seek medical attention immediately by calling 911 or calling your MD immediately  if symptoms less severe.  You Must read complete instructions/literature along with all the possible adverse reactions/side effects for all the Medicines you take and that have been prescribed to you. Take any new Medicines after you have completely understood and accpet all the possible adverse reactions/side effects.   Do not drive, operating heavy machinery, perform activities at heights, swimming or participation in water activities or provide baby sitting services if your were admitted for syncope or siezures until you have seen by Primary MD or a Neurologist and advised to do so again.  Do not drive when taking Pain medications.    Do not take more than prescribed Pain, Sleep and Anxiety  Medications  Special Instructions: If you have smoked or chewed Tobacco  in the last 2 yrs please stop smoking, stop any regular Alcohol  and or any Recreational drug use.  Wear Seat belts while driving.   Please note  You were cared for by a hospitalist during your hospital stay. If you have any questions about your discharge medications or the care you received while you were in the hospital after you are discharged, you can call the unit and asked to speak with the hospitalist on call if the hospitalist that took care of you is not available. Once you are discharged, your primary care physician will handle any further medical issues. Please note that NO REFILLS for any discharge medications will be authorized once you are discharged, as it is imperative that you return to your primary care physician (or establish a relationship with a primary care physician if you do not have one) for your aftercare needs so that they can reassess your need for medications and  monitor your lab values.   Increase activity slowly    Complete by:  As directed      Allergies as of 04/26/2016   No Known Allergies     Medication List    STOP taking these medications   loratadine 10 MG tablet Commonly known as:  CLARITIN     TAKE these medications   blood glucose meter kit and supplies Dispense based on patient and insurance preference. Use up to four times daily as directed. (FOR ICD-9 250.00, 250.01).   glipiZIDE 10 MG tablet Commonly known as:  GLUCOTROL Take 1 tablet (10 mg total) by mouth 2 (two) times daily before a meal.   hydrochlorothiazide 12.5 MG capsule Commonly known as:  MICROZIDE Take 1 capsule (12.5 mg total) by mouth daily. Start taking on:  04/27/2016   lisinopril 5 MG tablet Commonly known as:  PRINIVIL,ZESTRIL Take 1 tablet (5 mg total) by mouth daily. Start taking on:  04/27/2016   metFORMIN 1000 MG tablet Commonly known as:  GLUCOPHAGE Take 1 tablet (1,000 mg total) by mouth 2 (two) times daily with a meal.         Diet and Activity recommendation: See Discharge Instructions above   Consults obtained -  neurology   Major procedures and Radiology Reports - PLEASE review detailed and final reports for all details, in brief -     Ct Head Wo Contrast  Result Date: 04/24/2016 CLINICAL DATA:  Seizure today.  Slight headache. EXAM: CT HEAD WITHOUT CONTRAST TECHNIQUE: Contiguous axial images were obtained from the base of the skull through the vertex without intravenous contrast. COMPARISON:  None. FINDINGS: Brain: No evidence of acute infarction, hemorrhage, hydrocephalus, extra-axial collection or mass lesion/mass effect. Vascular: No hyperdense vessel or unexpected calcification. Skull: Normal. Negative for fracture or focal lesion. Sinuses/Orbits: Unremarkable. Other: None. IMPRESSION: Normal examination. Electronically Signed   By: Claudie Revering M.D.   On: 04/24/2016 17:48    Micro Results     Recent Results (from the  past 240 hour(s))  MRSA PCR Screening     Status: None   Collection Time: 04/24/16  8:42 PM  Result Value Ref Range Status   MRSA by PCR NEGATIVE NEGATIVE Final    Comment:        The GeneXpert MRSA Assay (FDA approved for NASAL specimens only), is one component of a comprehensive MRSA colonization surveillance program. It is not intended to diagnose MRSA infection nor to guide or monitor treatment for  MRSA infections.        Today   Subjective:   Tyler Morris today has no headache,no chest or abdominal pain,no new weakness tingling or numbness, feels much better wants to go home today.   Objective:   Blood pressure (!) 159/90, pulse 99, temperature 98.1 F (36.7 C), resp. rate 16, height _0  (1.753 m), weight (!) 167.5 kg (369 lb 4.3 oz), SpO2 98 %.   Intake/Output Summary (Last 24 hours) at 04/26/16 1639 Last data filed at 04/26/16 0935  Gross per 24 hour  Intake              520 ml  Output             1175 ml  Net             -655 ml    Exam Awake Alert, Oriented x 3, No new F.N deficits, Normal affect Supple Neck,No JVD,  Symmetrical Chest wall movement, Good air movement bilaterally, CTAB RRR,No Gallops,Rubs or new Murmurs, No Parasternal Heave +ve B.Sounds, Abd Soft, Non tender, No organomegaly appriciated, No rebound -guarding or rigidity. No Cyanosis, Clubbing or edema, No new Rash or bruise  Data Review   CBC w Diff:  Lab Results  Component Value Date   WBC 8.0 04/25/2016   HGB 12.5 (L) 04/25/2016   HCT 37.1 (L) 04/25/2016   PLT 171 04/25/2016   LYMPHOPCT 27 04/25/2016   MONOPCT 8 04/25/2016   EOSPCT 2 04/25/2016   BASOPCT 0 04/25/2016    CMP:  Lab Results  Component Value Date   NA 134 (L) 04/26/2016   K 3.6 04/26/2016   CL 103 04/26/2016   CO2 22 04/26/2016   BUN 9 04/26/2016   CREATININE 0.84 04/26/2016  .   Total Time in preparing paper work, data evaluation and todays exam - 35 minutes  Tyler Morris M.D on 04/26/2016 at  4:39 PM  [facility]   Office  [phone]

## 2016-04-26 NOTE — Consult Note (Signed)
NEURO HOSPITALIST CONSULT NOTE   Requestig physician: Dr. Randol Kern   Reason for Consult: seizure in setting of hyperglycemia   History obtained from:  Patient    HPI:                                                                                                                                          Tyler Morris is an 26 y.o. male presenting to the emergency department last night secondary to having multiple seizures while playing video games with his friends. Patient recall some of the event. He does recall that it just started playing the beginning of" [employer] football game", he states he had had nothing to drink such as alcohol, and denies taking any illicit drugs. The next thing he recalls is hearing his friends stating that he was having a seizure. Patient states that he was unable to answer them but could hear them. Initially his eyes were open but then closed and then he forgot some of what happened. He denies urinating on himself, defecating on himself, or biting his tongue or cheek. Patient states he has never had a seizure in the past, knows of no family history of seizure, denies any head trauma or concussion in the history of his childhood, and believes he was a normal birth at child. He denies any febrile seizures in the past.  Patient was recently just diagnosed with diabetes. He states that he has been working with his endocrinologist and PCP to get his blood sugar under control. On arrival to the emergency department patient was hypertensive with blood pressure 210/130 and a CABG that read "high". Once glucose was obtained it did read 739. CT of head was obtained and did not show any intracranial abnormalities such as tumor, bleed, or subacute stroke.  On consultation patient currently is in the EEG lounge being hooked up to the EEG and is back to his baseline.  Past Medical History:  Diagnosis Date  . Diabetes mellitus without complication (HCC)   .  Seizures (HCC)     History reviewed. No pertinent surgical history.  Family History  Problem Relation Age of Onset  . Hypertension Mother   . Diabetes Maternal Grandfather   . Hypertension Maternal Grandfather   . Hypertension Maternal Grandmother   . Hyperlipidemia Maternal Grandmother       Social History:  reports that he has never smoked. He has never used smokeless tobacco. He reports that he does not drink alcohol or use drugs.  No Known Allergies  MEDICATIONS:  Prior to Admission:  Prescriptions Prior to Admission  Medication Sig Dispense Refill Last Dose  . loratadine (CLARITIN) 10 MG tablet Take 10 mg by mouth daily.   04/24/2016 at am   Scheduled: . enoxaparin (LOVENOX) injection  80 mg Subcutaneous Q24H  . famotidine  20 mg Oral BID  . glipiZIDE  5 mg Oral BID AC  . hydrochlorothiazide  12.5 mg Oral Daily  . insulin aspart  0-15 Units Subcutaneous TID WC  . insulin aspart  0-5 Units Subcutaneous QHS  . lisinopril  5 mg Oral Daily  . metFORMIN  1,000 mg Oral BID WC  . potassium chloride  20 mEq Oral BID     ROS:                                                                                                                                       History obtained from chart review  General ROS: negative for - chills, fatigue, fever, night sweats, weight gain or weight loss Psychological ROS: negative for - behavioral disorder, hallucinations, memory difficulties, mood swings or suicidal ideation Ophthalmic ROS: negative for - blurry vision, double vision, eye pain or loss of vision ENT ROS: negative for - epistaxis, nasal discharge, oral lesions, sore throat, tinnitus or vertigo Allergy and Immunology ROS: negative for - hives or itchy/watery eyes Hematological and Lymphatic ROS: negative for - bleeding problems, bruising or swollen lymph  nodes Endocrine ROS: negative for - galactorrhea, hair pattern changes, polydipsia/polyuria or temperature intolerance Respiratory ROS: negative for - cough, hemoptysis, shortness of breath or wheezing Cardiovascular ROS: negative for - chest pain, dyspnea on exertion, edema or irregular heartbeat Gastrointestinal ROS: negative for - abdominal pain, diarrhea, hematemesis, nausea/vomiting or stool incontinence Genito-Urinary ROS: negative for - dysuria, hematuria, incontinence or urinary frequency/urgency Musculoskeletal ROS: negative for - joint swelling or muscular weakness Neurological ROS: as noted in HPI Dermatological ROS: negative for rash and skin lesion changes   Blood pressure (!) 153/85, pulse 83, temperature 97.7 F (36.5 C), temperature source Oral, resp. rate 18, height  (1.753 m), weight (!) 167.5 kg (369 lb 4.3 oz), SpO2 96 %.   Neurologic Examination:                                                                                                      HEENT-  Normocephalic, no lesions, without obvious abnormality.  Normal external eye and conjunctiva.  Normal TM's bilaterally.  Normal auditory canals and external ears. Normal  external nose, mucus membranes and septum.  Normal pharynx. Cardiovascular- S1, S2 normal, pulses palpable throughout   Lungs- chest clear, no wheezing, rales, normal symmetric air entry Abdomen- soft, non-tender; bowel sounds normal; no masses,  no organomegaly Extremities- no joint deformities, effusion, or inflammation Lymph-no adenopathy palpable Musculoskeletal-no joint tenderness, deformity or swelling Skin-warm and dry, no hyperpigmentation, vitiligo, or suspicious lesions  Neurological Examination Mental Status: Alert, oriented, thought content appropriate.  Speech fluent without evidence of aphasia.  Able to follow 3 step commands without difficulty. Cranial Nerves: II: Visual fields grossly normal,  III,IV, VI: ptosis not present,  extra-ocular motions intact bilaterally, pupils equal, round, reactive to light and accommodation V,VII: smile symmetric, facial light touch sensation normal bilaterally VIII: hearing normal bilaterally IX,X: uvula rises symmetrically XI: bilateral shoulder shrug XII: midline tongue extension Motor: Right : Upper extremity   5/5    Left:     Upper extremity   5/5  Lower extremity   5/5     Lower extremity   5/5 Tone and bulk:normal tone throughout; no atrophy noted Sensory: Pinprick and light touch intact throughout, bilaterally--currently patient has no decreased vibratory or proprioception in his lower extremities Deep Tendon Reflexes: 1+ and symmetric throughout Plantars: Right: downgoing   Left: downgoing Cerebellar: normal finger-to-nose,and normal heel-to-shin test Gait: Not tested as he is hooked up to EEG      Lab Results: Basic Metabolic Panel:  Recent Labs Lab 04/24/16 1712 04/24/16 2156 04/25/16 0030 04/25/16 0457 04/25/16 1220  NA 134* 141 142 143 138  K 4.2 4.0 4.0 3.4* 3.9  CL 100* 109 112* 113* 110  CO2 19* 22 21* 22 19*  GLUCOSE 739* 308* 241* 150* 301*  BUN CREATININE 1.57* 1.14 0.97 0.86 1.05  CALCIUM 9.5 8.9 8.5* 8.6* 8.5*  MG  --  2.3  --   --   --   PHOS  --  3.1  --   --   --     Liver Function Tests: No results for input(s): AST, ALT, ALKPHOS, BILITOT, PROT, ALBUMIN in the last 168 hours. No results for input(s): LIPASE, AMYLASE in the last 168 hours. No results for input(s): AMMONIA in the last 168 hours.  CBC:  Recent Labs Lab 04/24/16 1712 04/24/16 2156 04/25/16 0457  WBC 8.9 11.7* 8.0  NEUTROABS  --   --  5.1  HGB 15.4 13.8 12.5*  HCT 44.2 39.8 37.1*  MCV 82.3 81.6 82.1  PLT 204 204 171    Cardiac Enzymes: No results for input(s): CKTOTAL, CKMB, CKMBINDEX, TROPONINI in the last 168 hours.  Lipid Panel: No results for input(s): CHOL, TRIG, HDL, CHOLHDL, VLDL, LDLCALC in the last 168 hours.  CBG:  Recent  Labs Lab 04/25/16 1204 04/25/16 1650 04/25/16 1936 04/25/16 2106 04/26/16 0806  GLUCAP 294* 248* 247* 223* 195*    Microbiology: Results for orders placed or performed during the hospital encounter of 04/24/16  MRSA PCR Screening     Status: None   Collection Time: 04/24/16  8:42 PM  Result Value Ref Range Status   MRSA by PCR NEGATIVE NEGATIVE Final    Comment:        The GeneXpert MRSA Assay (FDA approved for NASAL specimens only), is one component of a comprehensive MRSA colonization surveillance program. It is not intended to diagnose MRSA infection nor to guide or monitor treatment for MRSA infections.     Coagulation Studies: No results for input(s): LABPROT, INR  in the last 72 hours.  Imaging: Ct Head Wo Contrast  Result Date: 04/24/2016 CLINICAL DATA:  Seizure today.  Slight headache. EXAM: CT HEAD WITHOUT CONTRAST TECHNIQUE: Contiguous axial images were obtained from the base of the skull through the vertex without intravenous contrast. COMPARISON:  None. FINDINGS: Brain: No evidence of acute infarction, hemorrhage, hydrocephalus, extra-axial collection or mass lesion/mass effect. Vascular: No hyperdense vessel or unexpected calcification. Skull: Normal. Negative for fracture or focal lesion. Sinuses/Orbits: Unremarkable. Other: None. IMPRESSION: Normal examination. Electronically Signed   By: Beckie Salts M.D.   On: 04/24/2016 17:48       Assessment and plan per attending neurologist  Felicie Morn PA-C [facility] [phone]  04/26/2016, 9:13 AM  I have seen and evaluated the patient. I have reviewed the above note. He denies family history, previous head injury, perinatal complications, or previous episodes of concerns. He has no significant findings on exam other than morbid obesity.      Assessment/Plan: 26 year old male with new onset seizure in the setting of hyperglycemia. Seizure likely provoked from the hyperglycemia. At this time  would not start an antiepileptic medication.    Because there was a clear provoking mechanism and EEG is normal, I do not think that the driving prohibitions applied to epileptic or idiopathic seizures would be applicable here. Once the medical service is confident that the provoking circumstances(e.g. Hyperglycemia or hypoglycemia) can be avoided then I think that driving could resume.   1) No antiepileptic medications 2) No further workup at this time 3) No need for driving prohibitions as long as provoking mechanisms (e.g. Hyperglycemia or hypoglycemia) can be avoided reliably.  4) Neurology will sign off at this time. Please call with any further questions or concerns.   Ritta Slot, MD [facility] [phone]  If 7pm- 7am, please page neurology on call as listed in [facility].

## 2016-04-26 NOTE — Discharge Instructions (Signed)
Follow with Primary MD  in 7 days   Get CBC, CMP,checked  by Primary MD next visit.    Activity: As tolerated with Full fall precautions use walker/cane & assistance as needed   Disposition Home   Diet: Carbohydrate modified, heart healthy diet   On your next visit with your primary care physician please Get Medicines reviewed and adjusted.   Please request your Prim.MD to go over all Hospital Tests and Procedure/Radiological results at the follow up, please get all Hospital records sent to your Prim MD by signing hospital release before you go home.   If you experience worsening of your admission symptoms, develop shortness of breath, life threatening emergency, suicidal or homicidal thoughts you must seek medical attention immediately by calling 911 or calling your MD immediately  if symptoms less severe.  You Must read complete instructions/literature along with all the possible adverse reactions/side effects for all the Medicines you take and that have been prescribed to you. Take any new Medicines after you have completely understood and accpet all the possible adverse reactions/side effects.   Do not drive, operating heavy machinery, perform activities at heights, swimming or participation in water activities or provide baby sitting services if your were admitted for syncope or siezures until you have seen by Primary MD or a Neurologist and advised to do so again.  Do not drive when taking Pain medications.    Do not take more than prescribed Pain, Sleep and Anxiety Medications  Special Instructions: If you have smoked or chewed Tobacco  in the last 2 yrs please stop smoking, stop any regular Alcohol  and or any Recreational drug use.  Wear Seat belts while driving.   Please note  You were cared for by a hospitalist during your hospital stay. If you have any questions about your discharge medications or the care you received while you were in the hospital after you are  discharged, you can call the unit and asked to speak with the hospitalist on call if the hospitalist that took care of you is not available. Once you are discharged, your primary care physician will handle any further medical issues. Please note that NO REFILLS for any discharge medications will be authorized once you are discharged, as it is imperative that you return to your primary care physician (or establish a relationship with a primary care physician if you do not have one) for your aftercare needs so that they can reassess your need for medications and monitor your lab values.

## 2016-04-26 NOTE — Procedures (Signed)
ELECTROENCEPHALOGRAM REPORT  Date of Study: 04/26/2016  Patient's Name: Tyler Morris MRN: [id number] Date of Birth: 1990-07-14  Referring Provider: Dr. Ritta Slot  Clinical History: This is a 26 year old man with seizures.  Medications: famotidine (PEPCID) tablet 20 mg  glipiZIDE (GLUCOTROL) tablet 5 mg  hydrochlorothiazide (MICROZIDE) capsule 12.5 mg  insulin aspart (novoLOG) injection 0-15 Units  insulin aspart (novoLOG) injection 0-5 Units  lisinopril (PRINIVIL,ZESTRIL) tablet 5 mg  metFORMIN (GLUCOPHAGE) tablet 1,000 mg   Technical Summary: A multichannel digital EEG recording measured by the international 10-20 system with electrodes applied with paste and impedances below 5000 ohms performed in our laboratory with EKG monitoring in an awake patient.  Hyperventilation and photic stimulation were performed.  The digital EEG was referentially recorded, reformatted, and digitally filtered in a variety of bipolar and referential montages for optimal display.    Description: The patient is awake during the recording.  During maximal wakefulness, there is a symmetric, low voltage 10.5 Hz posterior dominant rhythm that attenuates with eye opening.  The record is symmetric. Sleep is not captured.  Hyperventilation and photic stimulation did not elicit any abnormalities.  There were no epileptiform discharges or electrographic seizures seen.    EKG lead was unremarkable.  Impression: This awake EEG is normal.    Clinical Correlation: A normal EEG does not exclude a clinical diagnosis of epilepsy. Clinical correlation is advised.   Patrcia Dolly, M.D.

## 2016-05-03 ENCOUNTER — Ambulatory Visit (INDEPENDENT_AMBULATORY_CARE_PROVIDER_SITE_OTHER): Payer: Managed Care, Other (non HMO) | Admitting: Family Medicine

## 2016-05-03 ENCOUNTER — Encounter: Payer: Self-pay | Admitting: Family Medicine

## 2016-05-03 VITALS — BP 131/80 | HR 120 | Temp 98.3°F | Resp 16 | Ht 69.0 in | Wt 365.0 lb

## 2016-05-03 DIAGNOSIS — Z23 Encounter for immunization: Secondary | ICD-10-CM | POA: Diagnosis not present

## 2016-05-03 DIAGNOSIS — R7989 Other specified abnormal findings of blood chemistry: Secondary | ICD-10-CM

## 2016-05-03 DIAGNOSIS — I10 Essential (primary) hypertension: Secondary | ICD-10-CM

## 2016-05-03 DIAGNOSIS — E119 Type 2 diabetes mellitus without complications: Secondary | ICD-10-CM | POA: Diagnosis not present

## 2016-05-03 LAB — POCT URINALYSIS DIP (DEVICE)
Bilirubin Urine: NEGATIVE
Glucose, UA: 500 mg/dL — AB
HGB URINE DIPSTICK: NEGATIVE
Leukocytes, UA: NEGATIVE
Nitrite: NEGATIVE
PH: 5.5 (ref 5.0–8.0)
PROTEIN: 30 mg/dL — AB
SPECIFIC GRAVITY, URINE: 1.025 (ref 1.005–1.030)
Urobilinogen, UA: 0.2 mg/dL (ref 0.0–1.0)

## 2016-05-03 LAB — BASIC METABOLIC PANEL WITH GFR
BUN: 13 mg/dL (ref 7–25)
CO2: 24 mmol/L (ref 20–31)
Calcium: 9.9 mg/dL (ref 8.6–10.3)
Chloride: 97 mmol/L — ABNORMAL LOW (ref 98–110)
Creat: 1.11 mg/dL (ref 0.60–1.35)
GFR, Est Non African American: >89 mL/min (ref >=60)
Glucose, Bld: 227 mg/dL — ABNORMAL HIGH (ref 65–99)
POTASSIUM: 3.6 mmol/L (ref 3.5–5.3)
Sodium: 135 mmol/L (ref 135–146)

## 2016-05-03 LAB — POCT GLYCOSYLATED HEMOGLOBIN (HGB A1C): HEMOGLOBIN A1C: 10.4

## 2016-05-03 LAB — TSH: TSH: 2.13 mIU/L (ref 0.40–4.50)

## 2016-05-03 MED ORDER — INSULIN GLARGINE 100 UNIT/ML SOLOSTAR PEN: 20.0000 [IU] | PEN_INJECTOR | Freq: Every day | SUBCUTANEOUS | 11 refills | Status: DC

## 2016-05-03 MED ORDER — SAXAGLIPTIN HCL 2.5 MG PO TABS: 2.5000 mg | ORAL_TABLET | Freq: Every day | ORAL | 11 refills | Status: DC

## 2016-05-03 MED ORDER — INSULIN PEN NEEDLE 32G X 6 MM MISC: 1.0000 | Freq: Every day | 11 refills | Status: DC

## 2016-05-03 NOTE — Progress Notes (Signed)
Subjective:    Patient ID: Tyler Morris, male    DOB: 07-25-1990, 26 y.o.   MRN: [id number]  HPI  Mr. Tyler Morris, a 26 year old male with a history of type 2 diabetes mellitus presents to establish care. Mr. Tyler Morris has not had a primary provider. He was recently admitted to inpatient services after having a seizure at home. He was playing Xbox with some friends and had a generalized seizure. EMS was called and patient had a seizure during transport to the emergency department. On arrival to the hospital, patient was found to have a glucose of 739 and a markedly elevated blood pressure. He was admitted for diabetic ketoacidosis. Patient was discharged home on antidiabetic medications and antihypetensive medications and has been taking consistently. He has been check blood sugars. Symptoms have been well-controlled. Patient denies foot ulcerations, increase appetite, nausea, paresthesia of the feet, polydipsia, polyuria, visual disturbances and vomitting.  Evaluation to date has been included: hemoglobin A1C.  Home sugars: BGs range between 180 and 230.  He also has hypertension. He has not been checking blood pressure at home.  He is exercising and is adherent to low salt diet.Patient denies chest pain, dyspnea, lower extremity edema and tachypnea.  Cardiovascular risk factors include: diabetes mellitus and obesity (BMI >= 30 kg/m2). Patient also has a family history of diabetes and hypertension.  Past Medical History:  Diagnosis Date  . Diabetes mellitus without complication (HCC)   . Seizures [facility])    Social History   Social History  . Marital status: Single    Spouse name: N/A  . Number of children: N/A  . Years of education: N/A   Occupational History  . Not on file.   Social History Main Topics  . Smoking status: Never Smoker  . Smokeless tobacco: Never Used  . Alcohol use Yes     Comment: occ  . Drug use: No  . Sexual activity: Yes    Birth control/ protection: Condom    Other Topics Concern  . Not on file   Social History Narrative  . No narrative on file     Immunization History  Administered Date(s) Administered  . Pneumococcal Polysaccharide-23 05/03/2016  . Tdap 05/03/2016   Review of Systems  Constitutional: Positive for unexpected weight change.  HENT: Negative.   Eyes: Negative.   Respiratory: Negative.   Cardiovascular: Negative.  Negative for chest pain, palpitations and leg swelling.  Gastrointestinal: Negative.   Endocrine: Negative.  Negative for polydipsia, polyphagia and polyuria.  Genitourinary: Negative.  Negative for discharge, dysuria and enuresis.  Musculoskeletal: Negative.   Skin: Negative.   Allergic/Immunologic: Negative for environmental allergies, food allergies and immunocompromised state.  Neurological: Negative.  Negative for dizziness, seizures (Tyler Morris has not had a seizure since hospital admission), light-headedness and numbness.  Hematological: Negative.   Psychiatric/Behavioral: Negative.       Objective:   Physical Exam  Constitutional: He is oriented to person, place, and time. He appears well-developed and well-nourished.  Morbid obesity  HENT:  Head: Normocephalic and atraumatic.  Right Ear: External ear normal.  Left Ear: External ear normal.  Nose: Nose normal.  Mouth/Throat: Oropharynx is clear and moist.  Eyes: Conjunctivae are normal. Pupils are equal, round, and reactive to light.  Neck: Normal range of motion. Neck supple.  Cardiovascular: Normal rate, regular rhythm, normal heart sounds and intact distal pulses.   Pulmonary/Chest: Effort normal and breath sounds normal.  Abdominal: Soft. Bowel sounds are normal.  Increased abdominal girth  Musculoskeletal: Normal range of motion.  Neurological: He is alert and oriented to person, place, and time. He has normal reflexes.  Skin: Skin is warm and dry.  Psychiatric: He has a normal mood and affect. His behavior is normal. Judgment and  thought content normal.      BP 131/80 (BP Location: Right Arm, Patient Position: Sitting, Cuff Size: Large)   Pulse (!) 120   Temp 98.3 F (36.8 C) (Oral)   Resp 16   Ht  (1.753 m)   Wt (!) 365 lb (165.6 kg)   SpO2 97%   BMI 53.90 kg/m  Assessment & Plan:  1. Type 2 diabetes mellitus without complication, unspecified whether long term insulin use (HCC) Hemoglobin a1C is 10.4. Will continue glipizide 10 mg, , Metformin 1000 mg BID, and Januvia 50 mg daily. I will not add insulin at this time.  Recommend a lowfat, low carbohydrate diet divided over 5-6 small meals, increase water intake to 6-8 glasses, and 150 minutes per week of cardiovascular exercise.    - CINNAMON PO; Take by mouth. - Ambulatory referral to Ophthalmology - BASIC METABOLIC PANEL WITH GFR - POCT urinalysis dip (device) - HgB A1c - sitaGLIPtin (JANUVIA) 50 MG tablet; Take 1 tablet (50 mg total) by mouth daily.  Dispense: 30 tablet; Refill: 5  2. Morbid obesity (HCC) The patient is asked to make an attempt to improve diet and exercise patterns to aid in medical management of this problem.  - TSH - Lipid Panel; Future  3. Need for Tdap vaccination - Tdap vaccine greater than or equal to 7yo IM  4. Immunization due - Pneumococcal polysaccharide vaccine 23-valent greater than or equal to 2yo subcutaneous/IM  5. Essential hypertension Blood pressure is at goal on current medications.  - BASIC METABOLIC PANEL WITH GFR - POCT urinalysis dip (device)  6. Elevated serum creatinine Will check serum creatinine and GFR   RTC: 1 month for DMII   [place] Rennis Petty  MSN, FNP-C [facility] [address] [place], [place] [address] [phone]

## 2016-05-03 NOTE — Patient Instructions (Addendum)
Check blood sugars- fasting in am. 2 hours after breakfast and bedtime  Recommend a lowfat, low carbohydrate diet divided over 5-6 small meals, increase water intake to 6-8 glasses, and 150 minutes per week of cardiovascular exercise.    Diabetes Mellitus and Exercise Exercising regularly is important for your overall health, especially when you have diabetes (diabetes mellitus). Exercising is not only about losing weight. It has many health benefits, such as increasing muscle strength and bone density and reducing body fat and stress. This leads to improved fitness, flexibility, and endurance, all of which result in better overall health. Exercise has additional benefits for people with diabetes, including:  Reducing appetite.  Helping to lower and control blood glucose.  Lowering blood pressure.  Helping to control amounts of fatty substances (lipids) in the blood, such as cholesterol and triglycerides.  Helping the body to respond better to insulin (improving insulin sensitivity).  Reducing how much insulin the body needs.  Decreasing the risk for heart disease by:  Lowering cholesterol and triglyceride levels.  Increasing the levels of good cholesterol.  Lowering blood glucose levels. What is my activity plan? Your health care provider or certified diabetes educator can help you make a plan for the type and frequency of exercise (activity plan) that works for you. Make sure that you:  Do at least 150 minutes of moderate-intensity or vigorous-intensity exercise each week. This could be brisk walking, biking, or water aerobics.  Do stretching and strength exercises, such as yoga or weightlifting, at least 2 times a week.  Spread out your activity over at least 3 days of the week.  Get some form of physical activity every day.  Do not go more than 2 days in a row without some kind of physical activity.  Avoid being inactive for more than 90 minutes at a time. Take frequent  breaks to walk or stretch.  Choose a type of exercise or activity that you enjoy, and set realistic goals.  Start slowly, and gradually increase the intensity of your exercise over time. What do I need to know about managing my diabetes?  Check your blood glucose before and after exercising.  If your blood glucose is higher than 240 mg/dL (13.3 mmol/L) before you exercise, check your urine for ketones. If you have ketones in your urine, do not exercise until your blood glucose returns to normal.  Know the symptoms of low blood glucose (hypoglycemia) and how to treat it. Your risk for hypoglycemia increases during and after exercise. Common symptoms of hypoglycemia can include:  Hunger.  Anxiety.  Sweating and feeling clammy.  Confusion.  Dizziness or feeling light-headed.  Increased heart rate or palpitations.  Blurry vision.  Tingling or numbness around the mouth, lips, or tongue.  Tremors or shakes.  Irritability.  Keep a rapid-acting carbohydrate snack available before, during, and after exercise to help prevent or treat hypoglycemia.  Avoid injecting insulin into areas of the body that are going to be exercised. For example, avoid injecting insulin into:  The arms, when playing tennis.  The legs, when jogging.  Keep records of your exercise habits. Doing this can help you and your health care provider adjust your diabetes management plan as needed. Write down:  Food that you eat before and after you exercise.  Blood glucose levels before and after you exercise.  The type and amount of exercise you have done.  When your insulin is expected to peak, if you use insulin. Avoid exercising at times when your  insulin is peaking.  When you start a new exercise or activity, work with your health care provider to make sure the activity is safe for you, and to adjust your insulin, medicines, or food intake as needed.  Drink plenty of water while you exercise to prevent  dehydration or heat stroke. Drink enough fluid to keep your urine clear or pale yellow. This information is not intended to replace advice given to you by your health care provider. Make sure you discuss any questions you have with your health care provider. Document Released: 03/12/2003 Document Revised: 07/10/2015 Document Reviewed: 06/01/2015 Elsevier Interactive Patient Education  2017 [employer].  Exercising to [employer] Exercising can help you to lose weight. In order to lose weight through exercise, you need to do vigorous-intensity exercise. You can tell that you are exercising with vigorous intensity if you are breathing very hard and fast and cannot hold a conversation while exercising. Moderate-intensity exercise helps to maintain your current weight. You can tell that you are exercising at a moderate level if you have a higher heart rate and faster breathing, but you are still able to hold a conversation. How often should I exercise? Choose an activity that you enjoy and set realistic goals. Your health care provider can help you to make an activity plan that works for you. Exercise regularly as directed by your health care provider. This may include:  Doing resistance training twice each week, such as:  Push-ups.  Sit-ups.  Lifting weights.  Using resistance bands.  Doing a given intensity of exercise for a given amount of time. Choose from these options:  150 minutes of moderate-intensity exercise every week.  75 minutes of vigorous-intensity exercise every week.  A mix of moderate-intensity and vigorous-intensity exercise every week. Children, pregnant women, people who are out of shape, people who are overweight, and older adults may need to consult a health care provider for individual recommendations. If you have any sort of medical condition, be sure to consult your health care provider before starting a new exercise program. What are some activities that can help  me to lose weight?  Walking at a rate of at least 4.5 miles an hour.  Jogging or running at a rate of 5 miles per hour.  Biking at a rate of at least 10 miles per hour.  Lap swimming.  Roller-skating or in-line skating.  Cross-country skiing.  Vigorous competitive sports, such as football, basketball, and soccer.  Jumping rope.  Aerobic dancing. How can I be more active in my day-to-day activities?  Use the stairs instead of the elevator.  Take a walk during your lunch break.  If you drive, park your car farther away from work or school.  If you take public transportation, get off one stop early and walk the rest of the way.  Make all of your phone calls while standing up and walking around.  Get up, stretch, and walk around every 30 minutes throughout the day. What guidelines should I follow while exercising?  Do not exercise so much that you hurt yourself, feel dizzy, or get very short of breath.  Consult your health care provider prior to starting a new exercise program.  Wear comfortable clothes and shoes with good support.  Drink plenty of water while you exercise to prevent dehydration or heat stroke. Body water is lost during exercise and must be replaced.  Work out until you breathe faster and your heart beats faster. This information is not intended to  replace advice given to you by your health care provider. Make sure you discuss any questions you have with your health care provider. Document Released: 01/22/2010 Document Revised: 05/28/2015 Document Reviewed: 05/23/2013 Elsevier Interactive Patient Education  2017 [employer] Inc.  Insulin Injection Instructions, Using Insulin Pens, Adult A subcutaneous injection is a shot of medicine that is injected into the layer of fat between skin and muscle. People with type 1 diabetes must take insulin because their bodies do not make it. People with type 2 diabetes may need to take insulin. There are many different  types of insulin. The type of insulin that you take may determine how many injections you give yourself and when you need to take the injections. Choosing a site for injection Insulin absorption varies from site to site. It is best to inject insulin within the same body area, using a different spot in that area for each injection. Do not inject the insulin in the same spot for each injection. There are five main areas that can be used for injecting. These areas include:  Abdomen. This is the preferred area.  Front of thigh.  Upper, outer side of thigh.  Back of upper arm.  Buttocks. Using an insulin pen First, follow the steps for Getting Ready, then continue with the steps for Injecting the Insulin. Getting Ready  1. Wash your hands with soap and water. If soap and water are not available, use hand sanitizer. 2. Check the expiration date and type of insulin in the pen. 3. If you are using CLEAR insulin, check to see that it is clear and free of clumps. 4. If you are using CLOUDY insulin, gently roll the pen between your palms several times, or tip the pen up and down several times to mix up the medicine. Do not shake the pen. 5. Remove the cap from the insulin pen. 6. Use an alcohol wipe to clean the rubber stopper of the pen cartridge. 7. Remove the protective paper tab from the disposable needle. Do not let the needle touch anything. 8. Screw the needle onto the pen. 9. Remove the outer and inner plastic covers from the needle. Do not throw away the outer plastic cover yet. 10. Prime the insulin pen by turning the button (dial) to 2 units. Hold the pen with the needle pointing up, and push the button on the opposite end of the pen until a drop of insulin appears at the needle tip. If no insulin appears, repeat this step. 11. Dial the number of units of insulin that you will be injecting. Injecting the Insulin   1. Use an alcohol wipe to clean the site where you will be injecting the  needle. Let the site air-dry. 2. Hold the pen in the palm of your writing hand with your thumb on the top. 3. If directed by your health care provider, use your other hand to pinch and hold about an inch of skin at the injection site. Do not directly touch the cleaned part of the skin. 4. Gently but quickly, put the needle straight into the skin. The needle should be at a 90-degree angle (perpendicular) to the skin, as if to form the letter "L."  For example, if you are giving an injection in the abdomen, the abdomen forms one "leg" of the "L" and the needle forms the other "leg" of the "L." 5. For adults who have a small amount of body fat, the needle may need to be injected at a 45-degree  angle instead. Your health care provider will tell you if this is necessary.  A 45-degree angle looks like the letter "V." 6. When the needle is completely inserted into the skin, use the thumb of your writing hand to push the top button of the pen down all the way to inject the insulin. 7. Let go of the skin that you are pinching. Continue to hold the pen in place with your writing hand. 8. Wait five seconds, then pull the needle straight out of the skin. 9. Carefully put the larger (outer) plastic cover of the needle back over the needle, then unscrew the capped needle and discard it in a sharps container, such as an empty plastic bottle with a cover. 10. Put the plastic cap back on the insulin pen. Throwing away supplies  Discard all used needles in a puncture-proof sharps disposal container. You can ask your local pharmacy about where you can get this kind of disposal container, or you can use an empty liquid laundry detergent bottle that has a cover.  Follow the disposal regulations for the area where you live. Do not use any needle more than one time.  Throw away empty disposable pens in the regular trash. What questions should I ask my health care provider?  How often should I be taking  insulin?  How often should I check my blood glucose?  What amount of insulin should I be taking at each time?  What are the side effects?  What should I do if my blood glucose is too high?  What should I do if my blood glucose is too low?  What should I do if I forget to take my insulin?  What number should I call if I have questions? Where can I get more information?  American Diabetes Association (ADA): www.diabetes.org  American Association of Diabetes Educators (AADE) Patient Resources: https://www.diabeteseducator.org/patient-resources This information is not intended to replace advice given to you by your health care provider. Make sure you discuss any questions you have with your health care provider. Document Released: 01/23/2015 Document Revised: 05/28/2015 Document Reviewed: 01/23/2015 Elsevier Interactive Patient Education  2017 [place]. Insulin Glargine injection What is this medicine? INSULIN GLARGINE (IN su lin GLAR geen) is a human-made form of insulin. This drug lowers the amount of sugar in your blood. It is a long-acting insulin that is usually given once a day. This medicine may be used for other purposes; ask your health care provider or pharmacist if you have questions. COMMON BRAND NAME(S): BASAGLAR, Lantus, Lantus SoloStar, Toujeo SoloStar What should I tell my health care provider before I take this medicine? They need to know if you have any of these conditions: -episodes of low blood sugar -kidney disease -liver disease -an unusual or allergic reaction to insulin, metacresol, other medicines, foods, dyes, or preservatives -pregnant or trying to get pregnant -breast-feeding How should I use this medicine? This medicine is for injection under the skin. Use this medicine at the same time each day. Use exactly as directed. This insulin should never be mixed in the same syringe with other insulins before injection. Do not vigorously shake before use.  You will be taught how to use this medicine and how to adjust doses for activities and illness. Do not use more insulin than prescribed. Always check the appearance of your insulin before using it. This medicine should be clear and colorless like water. Do not use it if it is cloudy, thickened, colored, or has solid particles in  it. It is important that you put your used needles and syringes in a special sharps container. Do not put them in a trash can. If you do not have a sharps container, call your pharmacist or healthcare provider to get one. Talk to your pediatrician regarding the use of this medicine in children. Special care may be needed. Overdosage: If you think you have taken too much of this medicine contact a poison control center or emergency room at once. NOTE: This medicine is only for you. Do not share this medicine with others. What if I miss a dose? It is important not to miss a dose. Your health care professional or doctor should discuss a plan for missed doses with you. If you do miss a dose, follow their plan. Do not take double doses. What may interact with this medicine? -other medicines for diabetes Many medications may cause changes in blood sugar, these include: -alcohol containing beverages -antiviral medicines for HIV or AIDS -aspirin and aspirin-like drugs -certain medicines for blood pressure, heart disease, irregular heart beat -chromium -diuretics -male hormones, such as estrogens or progestins, birth control pills -fenofibrate -gemfibrozil -isoniazid -lanreotide -male hormones or anabolic steroids -MAOIs like Carbex, Eldepryl, Marplan, Nardil, and Parnate -medicines for weight loss -medicines for allergies, asthma, cold, or cough -medicines for depression, anxiety, or psychotic disturbances -niacin -nicotine -NSAIDs, medicines for pain and inflammation, like ibuprofen or  naproxen -octreotide -pasireotide -pentamidine -phenytoin -probenecid -quinolone antibiotics such as ciprofloxacin, levofloxacin, ofloxacin -some herbal dietary supplements -steroid medicines such as prednisone or cortisone -sulfamethoxazole; trimethoprim -thyroid hormones Some medications can hide the warning symptoms of low blood sugar (hypoglycemia). You may need to monitor your blood sugar more closely if you are taking one of these medications. These include: -beta-blockers, often used for high blood pressure or heart problems (examples include atenolol, metoprolol, propranolol) -clonidine -guanethidine -reserpine This list may not describe all possible interactions. Give your health care provider a list of all the medicines, herbs, non-prescription drugs, or dietary supplements you use. Also tell them if you smoke, drink alcohol, or use illegal drugs. Some items may interact with your medicine. What should I watch for while using this medicine? Visit your health care professional or doctor for regular checks on your progress. Do not drive, use machinery, or do anything that needs mental alertness until you know how this medicine affects you. Alcohol may interfere with the effect of this medicine. Avoid alcoholic drinks. A test called the HbA1C (A1C) will be monitored. This is a simple blood test. It measures your blood sugar control over the last 2 to 3 months. You will receive this test every 3 to 6 months. Learn how to check your blood sugar. Learn the symptoms of low and high blood sugar and how to manage them. Always carry a quick-source of sugar with you in case you have symptoms of low blood sugar. Examples include hard sugar candy or glucose tablets. Make sure others know that you can choke if you eat or drink when you develop serious symptoms of low blood sugar, such as seizures or unconsciousness. They must get medical help at once. Tell your doctor or health care professional  if you have high blood sugar. You might need to change the dose of your medicine. If you are sick or exercising more than usual, you might need to change the dose of your medicine. Do not skip meals. Ask your doctor or health care professional if you should avoid alcohol. Many nonprescription cough and cold  products contain sugar or alcohol. These can affect blood sugar. Make sure that you have the right kind of syringe for the type of insulin you use. Try not to change the brand and type of insulin or syringe unless your health care professional or doctor tells you to. Switching insulin brand or type can cause dangerously high or low blood sugar. Always keep an extra supply of insulin, syringes, and needles on hand. Use a syringe one time only. Throw away syringe and needle in a closed container to prevent accidental needle sticks. Insulin pens and cartridges should never be shared. Even if the needle is changed, sharing may result in passing of viruses like hepatitis or HIV. Wear a medical ID bracelet or chain, and carry a card that describes your disease and details of your medicine and dosage times. What side effects may I notice from receiving this medicine? Side effects that you should report to your doctor or health care professional as soon as possible: -allergic reactions like skin rash, itching or hives, swelling of the face, lips, or tongue -breathing problems -signs and symptoms of high blood sugar such as dizziness, dry mouth, dry skin, fruity breath, nausea, stomach pain, increased hunger or thirst, increased urination -signs and symptoms of low blood sugar such as feeling anxious, confusion, dizziness, increased hunger, unusually weak or tired, sweating, shakiness, cold, irritable, headache, blurred vision, fast heartbeat, loss of consciousness Side effects that usually do not require medical attention (report to your doctor or health care professional if they continue or are  bothersome): -increase or decrease in fatty tissue under the skin due to overuse of a particular injection site -itching, burning, swelling, or rash at site where injected This list may not describe all possible side effects. Call your doctor for medical advice about side effects. You may report side effects to FDA at 1-800-FDA-1088. Where should I keep my medicine? Keep out of the reach of children. Store unopened vials in a refrigerator between 2 and 8 degrees C (36 and 46 degrees F). Do not freeze or use if the insulin has been frozen. Opened vials (vials currently in use) may be stored in the refrigerator or at room temperature, at approximately 25 degrees C (77 degrees F) or cooler. Keeping your insulin at room temperature decreases the amount of pain during injection. Once opened, your insulin can be used for 28 days. After 28 days, the vial should be thrown away. Store Lantus [employer] in a refrigerator between 2 and 8 degrees C (36 and 46 degrees F) or at room temperature below 30 degrees C (86 degrees F). Do not freeze or use if the insulin has been frozen. Once opened, the pens should be kept at room temperature. Do not store in the refrigerator once opened. Once opened, the insulin can be used for 28 days. After 28 days, the Lantus Solostar Pen or Basaglar KwikPen should be thrown away. Store [employer] in a refrigerator between 2 and 8 degrees C (36 and 46 degrees F). Do not freeze or use if the insulin has been frozen. Once opened, the pens should be kept at room temperature below 30 degrees C (86 degrees F). Do not store in the refrigerator once opened. Once opened, the insulin can be used for 42 days. After 42 days, the [employer] Pen should be thrown away. Protect from light and excessive heat. Throw away any unused medicine after the expiration date or after the specified time for room  temperature storage has passed. NOTE: This sheet is a summary. It  may not cover all possible information. If you have questions about this medicine, talk to your doctor, pharmacist, or health care provider.  2018 Elsevier/Gold Standard (2016-01-06 10:26:25)  Hypoglycemia Hypoglycemia occurs when the level of sugar (glucose) in the blood is too low. Glucose is a type of sugar that provides the body's main source of energy. Certain hormones (insulin and glucagon) control the level of glucose in the blood. Insulin lowers blood glucose, and glucagon increases blood glucose. Hypoglycemia can result from having too much insulin in the bloodstream, or from not eating enough food that contains glucose. Hypoglycemia can happen in people who do or do not have diabetes. It can develop quickly, and it can be a medical emergency. What are the causes? Hypoglycemia occurs most often in people who have diabetes. If you have diabetes, hypoglycemia may be caused by:  Diabetes medicine.  Not eating enough, or not eating often enough.  Increased physical activity.  Drinking alcohol, especially when you have not eaten recently. If you do not have diabetes, hypoglycemia may be caused by:  A tumor in the pancreas. The pancreas is the organ that makes insulin.  Not eating enough, or not eating for long periods at a time (fasting).  Severe infection or illness that affects the liver, heart, or kidneys.  Certain medicines. You may also have reactive hypoglycemia. This condition causes hypoglycemia within 4 hours of eating a meal. This may occur after having stomach surgery. Sometimes, the cause of reactive hypoglycemia is not known. What increases the risk? Hypoglycemia is more likely to develop in:  People who have diabetes and take medicines to lower blood glucose.  People who abuse alcohol.  People who have a severe illness. What are the signs or symptoms? Hypoglycemia may not cause any symptoms. If you have symptoms, they may  include:  Hunger.  Anxiety.  Sweating and feeling clammy.  Confusion.  Dizziness or feeling light-headed.  Sleepiness.  Nausea.  Increased heart rate.  Headache.  Blurry vision.  Seizure.  Nightmares.  Tingling or numbness around the mouth, lips, or tongue.  A change in speech.  Decreased ability to concentrate.  A change in coordination.  Restless sleep.  Tremors or shakes.  Fainting.  Irritability. How is this diagnosed? Hypoglycemia is diagnosed with a blood test to measure your blood glucose level. This blood test is done while you are having symptoms. Your health care provider may also do a physical exam and review your medical history. If you do not have diabetes, other tests may be done to find the cause of your hypoglycemia. How is this treated? This condition can often be treated by immediately eating or drinking something that contains glucose, such as:  3-4 sugar tablets (glucose pills).  Glucose gel, 15-gram tube.  Fruit juice, 4 oz (120 mL).  Regular soda (not diet soda), 4 oz (120 mL).  Low-fat milk, 4 oz (120 mL).  Several pieces of hard candy.  Sugar or honey, 1 Tbsp. Treating Hypoglycemia If You Have Diabetes   If you are alert and able to swallow safely, follow the 15:15 rule:  Take 15 grams of a rapid-acting carbohydrate. Rapid-acting options include:  1 tube of glucose gel.  3 glucose pills.  6-8 pieces of hard candy.  4 oz (120 mL) of fruit juice.  4 oz (120 ml) of regular (not diet) soda.  Check your blood glucose 15 minutes after you take the carbohydrate.  If the repeat blood glucose level is still at or below 70 mg/dL (3.9 mmol/L), take 15 grams of a carbohydrate again.  If your blood glucose level does not increase above 70 mg/dL (3.9 mmol/L) after 3 tries, seek emergency medical care.  After your blood glucose level returns to normal, eat a meal or a snack within 1 hour. Treating Severe Hypoglycemia  Severe  hypoglycemia is when your blood glucose level is at or below 54 mg/dL (3 mmol/L). Severe hypoglycemia is an emergency. Do not wait to see if the symptoms will go away. Get medical help right away. Call your local emergency services (911 in the [place] Do not drive yourself to the hospital. If you have severe hypoglycemia and you cannot eat or drink, you may need an injection of glucagon. A family member or close friend should learn how to check your blood glucose and how to give you a glucagon injection. Ask your health care provider if you need to have an emergency glucagon injection kit available. Severe hypoglycemia may need to be treated in a hospital. The treatment may include getting glucose through an IV tube. You may also need treatment for the cause of your hypoglycemia. Follow these instructions at home: General instructions   Avoid any diets that cause you to not eat enough food. Talk with your health care provider before you start any new diet.  Take over-the-counter and prescription medicines only as told by your health care provider.  Limit alcohol intake to no more than 1 drink per day for nonpregnant women and 2 drinks per day for men. One drink equals 12 oz of beer, 5 oz of wine, or 1 oz of hard liquor.  Keep all follow-up visits as told by your health care provider. This is important. If You Have Diabetes:    Make sure you know the symptoms of hypoglycemia.  Always have a rapid-acting carbohydrate snack with you to treat low blood sugar.  Follow your diabetes management plan, as told by your health care provider. Make sure you:  Take your medicines as directed.  Follow your exercise plan.  Follow your meal plan. Eat on time, and do not skip meals.  Check your blood glucose as often as directed. Make sure to check your blood glucose before and after exercise. If you exercise longer or in a different way than usual, check your blood glucose more often.  Follow your sick  day plan whenever you cannot eat or drink normally. Make this plan in advance with your health care provider.  Share your diabetes management plan with people in your workplace, school, and household.  Check your urine for ketones when you are ill and as told by your health care provider.  Carry a medical alert card or wear medical alert jewelry. If You Have Reactive Hypoglycemia or Low Blood Sugar From Other Causes:   Monitor your blood glucose as told by your health care provider.  Follow instructions from your health care provider about eating or drinking restrictions. Contact a health care provider if:  You have problems keeping your blood glucose in your target range.  You have frequent episodes of hypoglycemia. Get help right away if:  You continue to have hypoglycemia symptoms after eating or drinking something containing glucose.  Your blood glucose is at or below 54 mg/dL (3 mmol/L).  You have a seizure.  You faint. These symptoms may represent a serious problem that is an emergency. Do not wait to see if the symptoms  will go away. Get medical help right away. Call your local emergency services (911 in the [place] Do not drive yourself to the hospital. This information is not intended to replace advice given to you by your health care provider. Make sure you discuss any questions you have with your health care provider. Document Released: 12/20/2004 Document Revised: 06/03/2015 Document Reviewed: 01/23/2015 Elsevier Interactive Patient Education  2017 [employer] Saxagliptin oral tablets What is this medicine? SAXAGLIPTIN (SAX a glip tin) helps to treat type 2 diabetes. It helps to control blood sugar. Treatment is combined with diet and exercise. This medicine may be used for other purposes; ask your health care provider or pharmacist if you have questions. COMMON BRAND NAME(S): Onglyza What should I tell my health care provider before I take this medicine? They need  to know if you have any of these conditions: -diabetic ketoacidosis -heart disease -heart failure -kidney disease -pancreatitis -previous swelling of the tongue, face, or lips with difficulty breathing, difficulty swallowing, hoarseness, or tightening of the throat -type 1 diabetes -an unusual or allergic reaction to saxagliptin, other medicines, foods, dyes, or preservatives -pregnant or trying to get pregnant -breast-feeding How should I use this medicine? Take this medicine by mouth with a glass of water. Follow the directions on the prescription label. You can take it with or without food. Do not cut, crush, or chew this medicine. Take your dose at the same time each day. Do not take more often than directed. Do not stop taking except on your doctor's advice. A special MedGuide will be given to you by the pharmacist with each prescription and refill. Be sure to read this information carefully each time. Talk to your pediatrician regarding the use of this medicine in children. Special care may be needed. Overdosage: If you think you have taken too much of this medicine contact a poison control center or emergency room at once. NOTE: This medicine is only for you. Do not share this medicine with others. What if I miss a dose? If you miss a dose, take it as soon as you can. If it is almost time for your next dose, take only that dose. Do not take double or extra doses. What may interact with this medicine? Do not take this medicine with any of the following medications: -gatifloxacin This medicine may also interact with the following medications: -alcohol -atazanavir -clarithromycin -indinavir -insulin -itraconazole -ketoconazole -nefazodone -nelfinavir -ritonavir -saquinavir -sulfonylureas like glimepiride, glipizide, glyburide -telithromycin This list may not describe all possible interactions. Give your health care provider a list of all the medicines, herbs, non-prescription  drugs, or dietary supplements you use. Also tell them if you smoke, drink alcohol, or use illegal drugs. Some items may interact with your medicine. What should I watch for while using this medicine? Visit your doctor or health care professional for regular checks on your progress. A test called the HbA1C (A1C) will be monitored. This is a simple blood test. It measures your blood sugar control over the last 2 to 3 months. You will receive this test every 3 to 6 months. Learn how to check your blood sugar. Learn the symptoms of low and high blood sugar and how to manage them. Always carry a quick-source of sugar with you in case you have symptoms of low blood sugar. Examples include hard sugar candy or glucose tablets. Make sure others know that you can choke if you eat or drink when you develop serious symptoms of low blood sugar, such  as seizures or unconsciousness. They must get medical help at once. Tell your doctor or health care professional if you have high blood sugar. You might need to change the dose of your medicine. If you are sick or exercising more than usual, you might need to change the dose of your medicine. Do not skip meals. Ask your doctor or health care professional if you should avoid alcohol. Many nonprescription cough and cold products contain sugar or alcohol. These can affect blood sugar. Wear a medical ID bracelet or chain, and carry a card that describes your disease and details of your medicine and dosage times. What side effects may I notice from receiving this medicine? Side effects that you should report to your doctor or health care professional as soon as possible: -allergic reactions like skin rash, itching or hives, swelling of the face, lips, or tongue -breathing problems -fever, chills -joint pain -pain when urinating -signs and symptoms of low blood sugar such as feeling anxious, confusion, dizziness, increased hunger, unusually weak or tired, sweating,  shakiness, cold, irritable, headache, blurred vision, fast heartbeat, loss of consciousness -swelling of the ankles, feet, hands -unusual stomach upset or pain -vomiting Side effects that usually do not require medical attention (report to your doctor or health care professional if they continue or are bothersome): -headache -stuffy or runny nose This list may not describe all possible side effects. Call your doctor for medical advice about side effects. You may report side effects to FDA at 1-800-FDA-1088. Where should I keep my medicine? Keep out of the reach of children. Store at room temperature between 15 and 30 degrees C (59 and 86 degrees F). Throw away any unused medicine after the expiration date. NOTE: This sheet is a summary. It may not cover all possible information. If you have questions about this medicine, talk to your doctor, pharmacist, or health care provider.  2018 Elsevier/Gold Standard (2014-04-09 13:06:26)

## 2016-05-04 MED ORDER — SITAGLIPTIN PHOSPHATE 50 MG PO TABS: 50.0000 mg | ORAL_TABLET | Freq: Every day | ORAL | 5 refills | Status: DC

## 2016-05-05 DIAGNOSIS — E119 Type 2 diabetes mellitus without complications: Secondary | ICD-10-CM | POA: Insufficient documentation

## 2016-06-03 ENCOUNTER — Ambulatory Visit: Payer: Managed Care, Other (non HMO) | Admitting: Family Medicine

## 2020-01-04 DIAGNOSIS — J189 Pneumonia, unspecified organism: Secondary | ICD-10-CM

## 2020-01-04 HISTORY — DX: Pneumonia, unspecified organism: J18.9

## 2020-01-05 ENCOUNTER — Ambulatory Visit
Admission: EM | Admit: 2020-01-05 | Discharge: 2020-01-05 | Disposition: A | Discharge status: Home / Self Care | Payer: Managed Care, Other (non HMO) | Attending: Family Medicine | Admitting: Family Medicine

## 2020-01-05 DIAGNOSIS — Z20822 Contact with and (suspected) exposure to covid-19: Secondary | ICD-10-CM | POA: Diagnosis not present

## 2020-01-05 NOTE — ED Triage Notes (Signed)
covid test

## 2020-01-06 NOTE — ED Provider Notes (Signed)
Here for COVID test and work note   Eustace Moore, MD 01/06/20 1146

## 2020-01-07 LAB — SARS-COV-2, NAA 2 DAY TAT

## 2020-01-07 LAB — NOVEL CORONAVIRUS, NAA: SARS-CoV-2, NAA: DETECTED — AB

## 2020-01-12 ENCOUNTER — Emergency Department (HOSPITAL_COMMUNITY): Payer: Managed Care, Other (non HMO)

## 2020-01-12 ENCOUNTER — Ambulatory Visit
Admission: EM | Admit: 2020-01-12 | Discharge: 2020-01-12 | Disposition: A | Discharge status: Home / Self Care | Payer: Managed Care, Other (non HMO) | Attending: Family Medicine | Admitting: Family Medicine

## 2020-01-12 ENCOUNTER — Encounter (HOSPITAL_COMMUNITY): Payer: Self-pay

## 2020-01-12 ENCOUNTER — Encounter: Payer: Self-pay | Admitting: Emergency Medicine

## 2020-01-12 ENCOUNTER — Inpatient Hospital Stay (HOSPITAL_COMMUNITY)
Admission: EM | Admit: 2020-01-12 | Discharge: 2020-01-14 | DRG: 177 | Disposition: A | Discharge status: Home / Self Care | Payer: Managed Care, Other (non HMO) | Attending: Internal Medicine | Admitting: Internal Medicine

## 2020-01-12 ENCOUNTER — Other Ambulatory Visit: Payer: Self-pay

## 2020-01-12 DIAGNOSIS — Z6841 Body Mass Index (BMI) 40.0 and over, adult: Secondary | ICD-10-CM | POA: Diagnosis not present

## 2020-01-12 DIAGNOSIS — R569 Unspecified convulsions: Secondary | ICD-10-CM | POA: Diagnosis present

## 2020-01-12 DIAGNOSIS — I10 Essential (primary) hypertension: Secondary | ICD-10-CM | POA: Diagnosis present

## 2020-01-12 DIAGNOSIS — U071 COVID-19: Principal | ICD-10-CM | POA: Diagnosis present

## 2020-01-12 DIAGNOSIS — Z7984 Long term (current) use of oral hypoglycemic drugs: Secondary | ICD-10-CM

## 2020-01-12 DIAGNOSIS — Z8249 Family history of ischemic heart disease and other diseases of the circulatory system: Secondary | ICD-10-CM

## 2020-01-12 DIAGNOSIS — E669 Obesity, unspecified: Secondary | ICD-10-CM | POA: Diagnosis present

## 2020-01-12 DIAGNOSIS — R000 Tachycardia, unspecified: Secondary | ICD-10-CM

## 2020-01-12 DIAGNOSIS — J1282 Pneumonia due to coronavirus disease 2019: Secondary | ICD-10-CM | POA: Diagnosis present

## 2020-01-12 DIAGNOSIS — Z83438 Family history of other disorder of lipoprotein metabolism and other lipidemia: Secondary | ICD-10-CM | POA: Diagnosis not present

## 2020-01-12 DIAGNOSIS — R0603 Acute respiratory distress: Secondary | ICD-10-CM | POA: Diagnosis not present

## 2020-01-12 DIAGNOSIS — E1110 Type 2 diabetes mellitus with ketoacidosis without coma: Secondary | ICD-10-CM | POA: Diagnosis present

## 2020-01-12 DIAGNOSIS — R0682 Tachypnea, not elsewhere classified: Secondary | ICD-10-CM

## 2020-01-12 DIAGNOSIS — E119 Type 2 diabetes mellitus without complications: Secondary | ICD-10-CM

## 2020-01-12 DIAGNOSIS — E1165 Type 2 diabetes mellitus with hyperglycemia: Secondary | ICD-10-CM

## 2020-01-12 DIAGNOSIS — Z79899 Other long term (current) drug therapy: Secondary | ICD-10-CM

## 2020-01-12 DIAGNOSIS — I2699 Other pulmonary embolism without acute cor pulmonale: Secondary | ICD-10-CM | POA: Diagnosis present

## 2020-01-12 DIAGNOSIS — Z833 Family history of diabetes mellitus: Secondary | ICD-10-CM | POA: Diagnosis not present

## 2020-01-12 DIAGNOSIS — J159 Unspecified bacterial pneumonia: Secondary | ICD-10-CM | POA: Diagnosis present

## 2020-01-12 DIAGNOSIS — R0902 Hypoxemia: Secondary | ICD-10-CM

## 2020-01-12 DIAGNOSIS — E1310 Other specified diabetes mellitus with ketoacidosis without coma: Secondary | ICD-10-CM | POA: Diagnosis not present

## 2020-01-12 DIAGNOSIS — J9601 Acute respiratory failure with hypoxia: Secondary | ICD-10-CM

## 2020-01-12 DIAGNOSIS — R0602 Shortness of breath: Secondary | ICD-10-CM | POA: Diagnosis not present

## 2020-01-12 LAB — CBG MONITORING, ED
Glucose-Capillary: 312 mg/dL — ABNORMAL HIGH (ref 70–99)
Glucose-Capillary: 278 mg/dL — ABNORMAL HIGH (ref 70–99)
Glucose-Capillary: 226 mg/dL — ABNORMAL HIGH (ref 70–99)
Glucose-Capillary: 210 mg/dL — ABNORMAL HIGH (ref 70–99)
Glucose-Capillary: 245 mg/dL — ABNORMAL HIGH (ref 70–99)
Glucose-Capillary: 211 mg/dL — ABNORMAL HIGH (ref 70–99)
Glucose-Capillary: 240 mg/dL — ABNORMAL HIGH (ref 70–99)

## 2020-01-12 LAB — CBC WITH DIFFERENTIAL/PLATELET
Abs Immature Granulocytes: 1.54 10*3/uL — ABNORMAL HIGH (ref 0.00–0.07)
Basophils Absolute: 0.2 10*3/uL — ABNORMAL HIGH (ref 0.0–0.1)
Basophils Relative: 1 %
Eosinophils Absolute: 0 10*3/uL (ref 0.0–0.5)
Eosinophils Relative: 0 %
HCT: 34.2 % — ABNORMAL LOW (ref 39.0–52.0)
Hemoglobin: 11.2 g/dL — ABNORMAL LOW (ref 13.0–17.0)
Immature Granulocytes: 9 %
Lymphocytes Relative: 6 %
Lymphs Abs: 1.1 10*3/uL (ref 0.7–4.0)
MCH: 29.2 pg (ref 26.0–34.0)
MCHC: 32.7 g/dL (ref 30.0–36.0)
MCV: 89.1 fL (ref 80.0–100.0)
Monocytes Absolute: 1.6 10*3/uL — ABNORMAL HIGH (ref 0.1–1.0)
Monocytes Relative: 9 %
Neutro Abs: 13.7 10*3/uL — ABNORMAL HIGH (ref 1.7–7.7)
Neutrophils Relative %: 75 %
Platelets: 268 10*3/uL (ref 150–400)
RBC: 3.84 MIL/uL — ABNORMAL LOW (ref 4.22–5.81)
RDW: 12.6 % (ref 11.5–15.5)
WBC: 18.2 10*3/uL — ABNORMAL HIGH (ref 4.0–10.5)
nRBC: 0 % (ref 0.0–0.2)

## 2020-01-12 LAB — D-DIMER, QUANTITATIVE: D-Dimer, Quant: 4.24 ug/mL-FEU — ABNORMAL HIGH (ref 0.00–0.50)

## 2020-01-12 LAB — BLOOD GAS, VENOUS
Acid-base deficit: 9 mmol/L — ABNORMAL HIGH (ref 0.0–2.0)
Bicarbonate: 17.1 mmol/L — ABNORMAL LOW (ref 20.0–28.0)
FIO2: 28
O2 Saturation: 75.2 %
Patient temperature: 37
pCO2, Ven: 30.3 mmHg — ABNORMAL LOW (ref 44.0–60.0)
pH, Ven: 7.334 (ref 7.250–7.430)
pO2, Ven: 48.9 mmHg — ABNORMAL HIGH (ref 32.0–45.0)

## 2020-01-12 LAB — COMPREHENSIVE METABOLIC PANEL
ALT: 44 U/L (ref 0–44)
AST: 37 U/L (ref 15–41)
Albumin: 2.6 g/dL — ABNORMAL LOW (ref 3.5–5.0)
Alkaline Phosphatase: 70 U/L (ref 38–126)
Anion gap: 16 — ABNORMAL HIGH (ref 5–15)
BUN: 9 mg/dL (ref 6–20)
CO2: 17 mmol/L — ABNORMAL LOW (ref 22–32)
Calcium: 8.5 mg/dL — ABNORMAL LOW (ref 8.9–10.3)
Chloride: 98 mmol/L (ref 98–111)
Creatinine, Ser: 0.91 mg/dL (ref 0.61–1.24)
GFR, Estimated: >60 mL/min (ref >60)
Glucose, Bld: 363 mg/dL — ABNORMAL HIGH (ref 70–99)
Potassium: 4.5 mmol/L (ref 3.5–5.1)
Sodium: 131 mmol/L — ABNORMAL LOW (ref 135–145)
Total Bilirubin: 1.5 mg/dL — ABNORMAL HIGH (ref 0.3–1.2)
Total Protein: 7.5 g/dL (ref 6.5–8.1)

## 2020-01-12 LAB — LACTIC ACID, PLASMA
Lactic Acid, Venous: 1.3 mmol/L (ref 0.5–1.9)
Lactic Acid, Venous: 1 mmol/L (ref 0.5–1.9)

## 2020-01-12 LAB — TRIGLYCERIDES: Triglycerides: 107 mg/dL (ref <150)

## 2020-01-12 LAB — BASIC METABOLIC PANEL
Anion gap: 16 — ABNORMAL HIGH (ref 5–15)
BUN: 8 mg/dL (ref 6–20)
CO2: 15 mmol/L — ABNORMAL LOW (ref 22–32)
Calcium: 8.3 mg/dL — ABNORMAL LOW (ref 8.9–10.3)
Chloride: 103 mmol/L (ref 98–111)
Creatinine, Ser: 0.83 mg/dL (ref 0.61–1.24)
GFR, Estimated: >60 mL/min (ref >60)
Glucose, Bld: 220 mg/dL — ABNORMAL HIGH (ref 70–99)
Potassium: 4 mmol/L (ref 3.5–5.1)
Sodium: 134 mmol/L — ABNORMAL LOW (ref 135–145)

## 2020-01-12 LAB — BETA-HYDROXYBUTYRIC ACID
Beta-Hydroxybutyric Acid: 5.03 mmol/L — ABNORMAL HIGH (ref 0.05–0.27)
Beta-Hydroxybutyric Acid: 6.42 mmol/L — ABNORMAL HIGH (ref 0.05–0.27)

## 2020-01-12 LAB — LACTATE DEHYDROGENASE: LDH: 1173 U/L — ABNORMAL HIGH (ref 98–192)

## 2020-01-12 LAB — PROCALCITONIN: Procalcitonin: 0.23 ng/mL

## 2020-01-12 LAB — HEMOGLOBIN A1C
Hgb A1c MFr Bld: 9.7 % — ABNORMAL HIGH (ref 4.8–5.6)
Mean Plasma Glucose: 231.69 mg/dL

## 2020-01-12 LAB — FERRITIN: Ferritin: 1855 ng/mL — ABNORMAL HIGH (ref 24–336)

## 2020-01-12 LAB — FIBRINOGEN: Fibrinogen: 758 mg/dL — ABNORMAL HIGH (ref 210–475)

## 2020-01-12 LAB — C-REACTIVE PROTEIN: CRP: 46.6 mg/dL — ABNORMAL HIGH (ref <1.0)

## 2020-01-12 LAB — HEPARIN LEVEL (UNFRACTIONATED): Heparin Unfractionated: 0.1 IU/mL — ABNORMAL LOW (ref 0.30–0.70)

## 2020-01-12 MED ORDER — DEXTROSE IN LACTATED RINGERS 5 % IV SOLN: INTRAVENOUS | Status: DC

## 2020-01-12 MED ORDER — SODIUM CHLORIDE 0.9 % IV SOLN
1000.0000 mL | INTRAVENOUS | Status: DC
  Administered 2020-01-12: 1000 mL via INTRAVENOUS

## 2020-01-12 MED ORDER — HEPARIN BOLUS VIA INFUSION
2000.0000 [IU] | Freq: Once | INTRAVENOUS | Status: AC
  Administered 2020-01-12: 2000 [IU] via INTRAVENOUS

## 2020-01-12 MED ORDER — ONDANSETRON HCL 4 MG PO TABS: 4.0000 mg | ORAL_TABLET | Freq: Four times a day (QID) | ORAL | Status: DC | PRN

## 2020-01-12 MED ORDER — ASCORBIC ACID 500 MG PO TABS
500.0000 mg | ORAL_TABLET | Freq: Every day | ORAL | Status: DC
  Administered 2020-01-12 – 2020-01-14 (×2): 500 mg via ORAL
  Filled 2020-01-12 (×2): qty 1

## 2020-01-12 MED ORDER — ZINC SULFATE 220 (50 ZN) MG PO CAPS
220.0000 mg | ORAL_CAPSULE | Freq: Every day | ORAL | Status: DC
  Administered 2020-01-12 – 2020-01-14 (×2): 220 mg via ORAL
  Filled 2020-01-12 (×2): qty 1

## 2020-01-12 MED ORDER — LACTATED RINGERS IV SOLN: INTRAVENOUS | Status: DC

## 2020-01-12 MED ORDER — HEPARIN (PORCINE) 25000 UT/250ML-% IV SOLN
2400.0000 [IU]/h | INTRAVENOUS | Status: DC
  Administered 2020-01-12: 15:00:00 1650 [IU]/h via INTRAVENOUS
  Administered 2020-01-13: 1900 [IU]/h via INTRAVENOUS
  Filled 2020-01-12 (×3): qty 250

## 2020-01-12 MED ORDER — DEXAMETHASONE SODIUM PHOSPHATE 10 MG/ML IJ SOLN
6.0000 mg | INTRAMUSCULAR | Status: DC
  Administered 2020-01-13 – 2020-01-14 (×2): 6 mg via INTRAVENOUS
  Filled 2020-01-12 (×2): qty 1

## 2020-01-12 MED ORDER — ACETAMINOPHEN 325 MG PO TABS: 650.0000 mg | ORAL_TABLET | Freq: Four times a day (QID) | ORAL | Status: DC | PRN

## 2020-01-12 MED ORDER — ONDANSETRON HCL 4 MG/2ML IJ SOLN: 4.0000 mg | Freq: Four times a day (QID) | INTRAMUSCULAR | Status: DC | PRN

## 2020-01-12 MED ORDER — DEXAMETHASONE SODIUM PHOSPHATE 10 MG/ML IJ SOLN
10.0000 mg | Freq: Once | INTRAMUSCULAR | Status: AC
  Administered 2020-01-12: 10 mg via INTRAMUSCULAR

## 2020-01-12 MED ORDER — SODIUM CHLORIDE 0.9 % IV SOLN
1.0000 g | INTRAVENOUS | Status: DC
  Administered 2020-01-13: 1 g via INTRAVENOUS
  Filled 2020-01-12: qty 10

## 2020-01-12 MED ORDER — POLYETHYLENE GLYCOL 3350 17 G PO PACK: 17.0000 g | PACK | Freq: Every day | ORAL | Status: DC | PRN

## 2020-01-12 MED ORDER — SODIUM CHLORIDE 0.9 % IV SOLN
500.0000 mg | INTRAVENOUS | Status: DC
  Administered 2020-01-12 – 2020-01-13 (×2): 500 mg via INTRAVENOUS
  Filled 2020-01-12 (×2): qty 500

## 2020-01-12 MED ORDER — SODIUM CHLORIDE 0.9 % IV SOLN
1.0000 g | Freq: Once | INTRAVENOUS | Status: AC
  Administered 2020-01-12: 1 g via INTRAVENOUS
  Filled 2020-01-12: qty 10

## 2020-01-12 MED ORDER — POTASSIUM CHLORIDE 10 MEQ/100ML IV SOLN
10.0000 meq | INTRAVENOUS | Status: AC
  Administered 2020-01-12 (×2): 10 meq via INTRAVENOUS
  Filled 2020-01-12 (×2): qty 100

## 2020-01-12 MED ORDER — SODIUM CHLORIDE 0.9 % IV SOLN
100.0000 mg | Freq: Every day | INTRAVENOUS | Status: DC
  Administered 2020-01-13 – 2020-01-14 (×2): 100 mg via INTRAVENOUS
  Filled 2020-01-12 (×2): qty 20

## 2020-01-12 MED ORDER — DEXTROSE 50 % IV SOLN: 0.0000 mL | INTRAVENOUS | Status: DC | PRN

## 2020-01-12 MED ORDER — ALBUTEROL SULFATE HFA 108 (90 BASE) MCG/ACT IN AERS
2.0000 | INHALATION_SPRAY | Freq: Four times a day (QID) | RESPIRATORY_TRACT | Status: DC
  Administered 2020-01-12 – 2020-01-14 (×7): 2 via RESPIRATORY_TRACT
  Filled 2020-01-12: qty 6.7

## 2020-01-12 MED ORDER — INSULIN ASPART 100 UNIT/ML ~~LOC~~ SOLN
8.0000 [IU] | Freq: Once | SUBCUTANEOUS | Status: AC
  Administered 2020-01-12: 8 [IU] via SUBCUTANEOUS
  Filled 2020-01-12: qty 1

## 2020-01-12 MED ORDER — IOHEXOL 350 MG/ML SOLN
80.0000 mL | Freq: Once | INTRAVENOUS | Status: AC | PRN
  Administered 2020-01-12: 80 mL via INTRAVENOUS

## 2020-01-12 MED ORDER — INSULIN REGULAR(HUMAN) IN NACL 100-0.9 UT/100ML-% IV SOLN
INTRAVENOUS | Status: DC
  Administered 2020-01-12: 15 [IU]/h via INTRAVENOUS
  Administered 2020-01-13: 19 [IU]/h via INTRAVENOUS
  Administered 2020-01-13: 9.5 [IU]/h via INTRAVENOUS
  Filled 2020-01-12 (×4): qty 100

## 2020-01-12 MED ORDER — SODIUM CHLORIDE 0.9 % IV SOLN
100.0000 mg | INTRAVENOUS | Status: AC
  Administered 2020-01-12 (×2): 100 mg via INTRAVENOUS
  Filled 2020-01-12 (×2): qty 20

## 2020-01-12 MED ORDER — HEPARIN BOLUS VIA INFUSION
4000.0000 [IU] | Freq: Once | INTRAVENOUS | Status: AC
  Administered 2020-01-12: 4000 [IU] via INTRAVENOUS

## 2020-01-12 MED ORDER — ACETAMINOPHEN 500 MG PO TABS
1000.0000 mg | ORAL_TABLET | Freq: Once | ORAL | Status: AC
  Administered 2020-01-12: 1000 mg via ORAL
  Filled 2020-01-12: qty 2

## 2020-01-12 MED ORDER — GUAIFENESIN-DM 100-10 MG/5ML PO SYRP: 10.0000 mL | ORAL_SOLUTION | ORAL | Status: DC | PRN

## 2020-01-12 NOTE — Progress Notes (Signed)
ANTICOAGULATION CONSULT NOTE - Initial Consult  Pharmacy Consult for heparin gtt  Indication: pulmonary embolus  No Known Allergies  Patient Measurements: Height: 5\' 9"  (175.3 cm) Weight: 136.1 kg (300 lb) IBW/kg (Calculated) : 70.7 Heparin Dosing Weight: HEPARIN DW (KG): 102.7   Vital Signs: Temp: 99.6 F (37.6 C) (01/09 1006) Temp Source: Oral (01/09 1006) BP: 131/92 (01/09 1430) Pulse Rate: 104 (01/09 1430)  Labs: Recent Labs    01/12/20 1217  HGB 11.2*  HCT 34.2*  PLT 268  CREATININE 0.91    Estimated Creatinine Clearance: 167.1 mL/min (by C-G formula based on SCr of 0.91 mg/dL).   Medical History: Past Medical History:  Diagnosis Date  . Diabetes mellitus without complication (HCC)   . Seizures (H[facility])     Medications:  (Not in a hospital admission)  Scheduled:  . heparin  4,000 Units Intravenous Once  . insulin aspart  8 Units Subcutaneous Once   Infusions:  . sodium chloride 1,000 mL (01/12/20 1251)  . azithromycin    . cefTRIAXone (ROCEPHIN)  IV    . heparin     PRN:  Anti-infectives (From admission, onward)   Start     Dose/Rate Route Frequency Ordered Stop   01/12/20 1445  cefTRIAXone (ROCEPHIN) 1 g in sodium chloride 0.9 % 100 mL IVPB        1 g 200 mL/hr over 30 Minutes Intravenous  Once 01/12/20 1443     01/12/20 1445  azithromycin (ZITHROMAX) 500 mg in sodium chloride 0.9 % 250 mL IVPB        500 mg 250 mL/hr over 60 Minutes Intravenous Every 24 hours 01/12/20 1443        Assessment: Tyler Morris a 30 y.o. male requires anticoagulation with a heparin iv infusion for the indication of  pulmonary embolus. Heparin gtt will be started following pharmacy protocol per pharmacy consult. Patient is not on previous oral anticoagulant that will require aPTT/HL correlation before transitioning to only HL monitoring.   Goal of Therapy:  Heparin level 0.3-0.7 units/ml Monitor platelets by anticoagulation protocol: Yes   Plan:  Give 4000 units  bolus x 1 Start heparin infusion at 1650 units/hr Check anti-Xa level in 6 hours and daily while on heparin Continue to monitor H&H and platelets   34 [name] 01/12/2020,2:55 PM

## 2020-01-12 NOTE — Progress Notes (Signed)
ANTICOAGULATION CONSULT NOTE   Pharmacy Consult for heparin gtt  Indication: pulmonary embolus  30 yo man on heparin for PE.  Heparin level 0.1 units/ml  Goal of Therapy:  Heparin level 0.3-0.7 units/ml Monitor platelets by anticoagulation protocol: Yes   Plan:  Give 2000 units bolus x 1 Increase heparin infusion to 1900 units/hr Check anti-Xa level in 6 hours and daily while on heparin Continue to monitor H&H and platelets   Tyler Morris, [name] 01/12/2020,11:13 PM

## 2020-01-12 NOTE — ED Notes (Signed)
Patient returned to room at this time.

## 2020-01-12 NOTE — ED Provider Notes (Signed)
[facility]   [id number] 01/12/20 Arrival Time: 2595   CC: COVID symptoms  SUBJECTIVE: History from: patient.  Tyler Morris is a 30 y.o. male who presents with cough, chest discomfort, SOB, fatigue, weakness, dizziness x 2 days. Reports that he recently tested positive for Covid. Has been taking Mucinex for congestion with no relief. Has not taken OTC medications for this. SOB and cough are worse with activity. Denies previous symptoms in the past. Denies fever, sinus pain, rhinorrhea, sore throat, nausea, changes in bowel or bladder habits.    ROS: As per HPI.  All other pertinent ROS negative.     Past Medical History:  Diagnosis Date  . Diabetes mellitus without complication ([place])   . Seizures ([place])    History reviewed. No pertinent surgical history. No Known Allergies No current facility-administered medications on file prior to encounter.   Current Outpatient Medications on File Prior to Encounter  Medication Sig Dispense Refill  . blood glucose meter kit and supplies Dispense based on patient and insurance preference. Use up to four times daily as directed. (FOR ICD-9 250.00, 250.01). 1 each 0  . CINNAMON PO Take by mouth.    Marland Kitchen glipiZIDE (GLUCOTROL) 10 MG tablet Take 1 tablet (10 mg total) by mouth 2 (two) times daily before a meal. 60 tablet 0  . hydrochlorothiazide (MICROZIDE) 12.5 MG capsule Take 1 capsule (12.5 mg total) by mouth daily. 30 capsule 0  . lisinopril (PRINIVIL,ZESTRIL) 5 MG tablet Take 1 tablet (5 mg total) by mouth daily. 30 tablet 0  . metFORMIN (GLUCOPHAGE) 1000 MG tablet Take 1 tablet (1,000 mg total) by mouth 2 (two) times daily with a meal. 60 tablet 0  . sitaGLIPtin (JANUVIA) 50 MG tablet Take 1 tablet (50 mg total) by mouth daily. 30 tablet 5   Social History   Socioeconomic History  . Marital status: Single    Spouse name: Not on file  . Number of children: Not on file  . Years of education: Not on file  . Highest education level: Not  on file  Occupational History  . Not on file  Tobacco Use  . Smoking status: Never Smoker  . Smokeless tobacco: Never Used  Vaping Use  . Vaping Use: Never used  Substance and Sexual Activity  . Alcohol use: Yes    Comment: occ  . Drug use: No  . Sexual activity: Yes    Birth control/protection: Condom  Other Topics Concern  . Not on file  Social History Narrative  . Not on file   Social Determinants of Health   Financial Resource Strain: Not on file  Food Insecurity: Not on file  Transportation Needs: Not on file  Physical Activity: Not on file  Stress: Not on file  Social Connections: Not on file  Intimate Partner Violence: Not on file   Family History  Problem Relation Age of Onset  . Hypertension Mother   . Diabetes Maternal Grandfather   . Hypertension Maternal Grandfather   . Hypertension Maternal Grandmother   . Hyperlipidemia Maternal Grandmother     OBJECTIVE:  Vitals:   01/12/20 0818 01/12/20 0819 01/12/20 0833 01/12/20 0834  BP: 137/87     Pulse: (!) 132     Resp: (!) 24     Temp: 98.3 F (36.8 C)     TempSrc: Oral     SpO2: (!) 82% 90%  93%  Weight:   300 lb (136.1 kg)   Height:   _0  (1.753 m)  General appearance: alert; appears fatigued, but nontoxic; unable to speak in full sentences, appears distressed HEENT: NCAT; Ears: EACs clear, TMs pearly gray; Eyes: PERRL.  EOM grossly intact. Sinuses: nontender; Nose: nares patent with clear rhinorrhea, Throat: oropharynx erythematous, cobblestoning present, tonsils non erythematous or enlarged, uvula midline  Neck: supple without LAD Lungs: labored respirations; cough: moderate; no respiratory distress; diminished lung sounds throughout bilateral lung fields Heart: regular rate and rhythm.  Radial pulses 2+ symmetrical bilaterally Skin: warm and dry Psychological: alert and cooperative; normal mood and affect  LABS:  No results found for this or any previous visit (from the past 24 hour(s)).    ASSESSMENT & PLAN:  1. Respiratory distress   2. COVID-19   3. Tachycardia   4. Tachypnea   5. Hypoxia     Meds ordered this encounter  Medications  . dexamethasone (DECADRON) injection 10 mg   Decadron 40m  IM in office today O2 sats 82% on RA 4 lpm O2 via [place] to get sats to 90% Discussed with patient that he needs to go to the ER for further evaluation and treatment Declines at this time Discussed that he is needing O2 to maintain safe oxygen saturations and he needs a higher level of care than we can provide Discussed his risk of respiratory failure  Still declines the ER at this time Discussed that it is not safe to drive himself in this condition, states that there is no one to pick him up Declines EMS as well       MFaustino Congress NP 01/12/20 [phone]

## 2020-01-12 NOTE — Discharge Instructions (Addendum)
I strongly urge you to go to the ER for further evaluation and treatment  Your oxygen level is dangerously low

## 2020-01-12 NOTE — ED Triage Notes (Signed)
Patient received positive covid results on Tuesday.  C/o today of prosistant cough.  O2 stats in the lower 80s.  Patient is refusing EMS to transport to ED.  States he cannot go right now due to family issues.

## 2020-01-12 NOTE — ED Notes (Signed)
RN provided Pt instruction on use off Inhaler. Pt demonstrated proper use and verbal feedback in front of RN.

## 2020-01-12 NOTE — ED Provider Notes (Signed)
[facility] EMERGENCY DEPARTMENT Provider Note   CSN: [id number] Arrival date & time: 01/12/20  3149     History Chief Complaint  Patient presents with  . Low Oxygen    Tyler Morris is a 30 y.o. male.  Pt presents to the ED today with sob and cough.  Pt was diagnosed with covid on 1/2.  He has been more sob for the past 2 days.  He has felt dizzy.  His mother just passed away 2 days from a head bleed after falling and he initially thought it was that.  Pt has not taken any of his meds today.  He has not been vaccinated against Covid.  Pt initially went to UC who said his O2 sat was 82% on RA.  He was given decadron 10 mg IM and told to come here.  He declined EMS.         Past Medical History:  Diagnosis Date  . Diabetes mellitus without complication ([place])   . Seizures [facility])     Patient Active Problem List   Diagnosis Date Noted  . Type 2 diabetes mellitus without complication ([place]) 70/26/3785  . Seizure ([place])   . DKA (diabetic ketoacidoses) 04/24/2016  . Seizures ([place]) 04/24/2016  . Elevated serum creatinine 04/24/2016    History reviewed. No pertinent surgical history.     Family History  Problem Relation Age of Onset  . Hypertension Mother   . Diabetes Maternal Grandfather   . Hypertension Maternal Grandfather   . Hypertension Maternal Grandmother   . Hyperlipidemia Maternal Grandmother     Social History   Tobacco Use  . Smoking status: Never Smoker  . Smokeless tobacco: Never Used  Vaping Use  . Vaping Use: Never used  Substance Use Topics  . Alcohol use: Yes    Comment: occ  . Drug use: No    Home Medications Prior to Admission medications   Medication Sig Start Date End Date Taking? Authorizing Provider  blood glucose meter kit and supplies Dispense based on patient and insurance preference. Use up to four times daily as directed. (FOR ICD-9 250.00, 250.01). 04/26/16   Elgergawy, Silver Huguenin, MD  CINNAMON PO Take by mouth.    [provider]  glipiZIDE (GLUCOTROL) 10 MG tablet Take 1 tablet (10 mg total) by mouth 2 (two) times daily before a meal. 04/26/16   Elgergawy, Silver Huguenin, MD  hydrochlorothiazide (MICROZIDE) 12.5 MG capsule Take 1 capsule (12.5 mg total) by mouth daily. 04/27/16   Elgergawy, Silver Huguenin, MD  lisinopril (PRINIVIL,ZESTRIL) 5 MG tablet Take 1 tablet (5 mg total) by mouth daily. 04/27/16   Elgergawy, Silver Huguenin, MD  metFORMIN (GLUCOPHAGE) 1000 MG tablet Take 1 tablet (1,000 mg total) by mouth 2 (two) times daily with a meal. 04/26/16   Elgergawy, Silver Huguenin, MD  sitaGLIPtin (JANUVIA) 50 MG tablet Take 1 tablet (50 mg total) by mouth daily. 05/04/16   Dorena Dew, FNP    Allergies    Patient has no known allergies.  Review of Systems   Review of Systems  Respiratory: Positive for shortness of breath.   Neurological: Positive for dizziness.  All other systems reviewed and are negative.   Physical Exam Updated Vital Signs BP (!) 131/92   Pulse (!) 104   Temp 99.6 F (37.6 C) (Oral)   Resp 17   Ht 5' 9" (1.753 m)   Wt 136.1 kg   SpO2 95%   BMI 44.30 kg/m   Physical Exam  Vitals and nursing note reviewed.  Constitutional:      Appearance: He is obese. He is ill-appearing.  HENT:     Head: Normocephalic and atraumatic.     Right Ear: External ear normal.     Left Ear: External ear normal.     Nose: Nose normal.     Mouth/Throat:     Mouth: Mucous membranes are moist.     Pharynx: Oropharynx is clear.  Eyes:     Extraocular Movements: Extraocular movements intact.     Conjunctiva/sclera: Conjunctivae normal.     Pupils: Pupils are equal, round, and reactive to light.  Cardiovascular:     Rate and Rhythm: Tachycardia present.     Pulses: Normal pulses.     Heart sounds: Normal heart sounds.  Pulmonary:     Effort: Pulmonary effort is normal.     Breath sounds: Normal breath sounds.  Abdominal:     General: Abdomen is flat. Bowel sounds are normal.     Palpations: Abdomen is soft.   Musculoskeletal:        General: Normal range of motion.     Cervical back: Normal range of motion and neck supple.  Skin:    General: Skin is warm.     Capillary Refill: Capillary refill takes less than 2 seconds.  Neurological:     General: No focal deficit present.     Mental Status: He is alert and oriented to person, place, and time.  Psychiatric:        Mood and Affect: Mood normal.        Behavior: Behavior normal.        Thought Content: Thought content normal.        Judgment: Judgment normal.     ED Results / Procedures / Treatments   Labs (all labs ordered are listed, but only abnormal results are displayed) Labs Reviewed  CBC WITH DIFFERENTIAL/PLATELET - Abnormal; Notable for the following components:      Result Value   WBC 18.2 (*)    RBC 3.84 (*)    Hemoglobin 11.2 (*)    HCT 34.2 (*)    Neutro Abs 13.7 (*)    Monocytes Absolute 1.6 (*)    Basophils Absolute 0.2 (*)    Abs Immature Granulocytes 1.54 (*)    All other components within normal limits  COMPREHENSIVE METABOLIC PANEL - Abnormal; Notable for the following components:   Sodium 131 (*)    CO2 17 (*)    Glucose, Bld 363 (*)    Calcium 8.5 (*)    Albumin 2.6 (*)    Total Bilirubin 1.5 (*)    Anion gap 16 (*)    All other components within normal limits  D-DIMER, QUANTITATIVE (NOT AT [facility]) - Abnormal; Notable for the following components:   D-Dimer, Quant 4.24 (*)    All other components within normal limits  LACTATE DEHYDROGENASE - Abnormal; Notable for the following components:   LDH 1,173 (*)    All other components within normal limits  FIBRINOGEN - Abnormal; Notable for the following components:   Fibrinogen 758 (*)    All other components within normal limits  CULTURE, BLOOD (ROUTINE X 2)  CULTURE, BLOOD (ROUTINE X 2)  LACTIC ACID, PLASMA  PROCALCITONIN  TRIGLYCERIDES  LACTIC ACID, PLASMA  FERRITIN  C-REACTIVE PROTEIN  HEPARIN LEVEL (UNFRACTIONATED)    EKG EKG  Interpretation  Date/Time:  Sunday January 12 2020 12:35:38 EST Ventricular Rate:  119 PR Interval:  QRS Duration: 87 QT Interval:  336 QTC Calculation: 473 R Axis:   75 Text Interpretation: Sinus tachycardia ST elev, probable normal early repol pattern Borderline prolonged QT interval No significant change since last tracing Confirmed by Isla Pence [phone]) on 01/12/2020 2:58:06 PM   Radiology CT Angio Chest PE W and/or Wo Contrast  Result Date: 01/12/2020 CLINICAL DATA:  Coronavirus infection. Pulmonary embolism suspected. EXAM: CT ANGIOGRAPHY CHEST WITH CONTRAST TECHNIQUE: Multidetector CT imaging of the chest was performed using the standard protocol during bolus administration of intravenous contrast. Multiplanar CT image reconstructions and MIPs were obtained to evaluate the vascular anatomy. CONTRAST:  39m OMNIPAQUE IOHEXOL 350 MG/ML SOLN COMPARISON:  Radiography same day. FINDINGS: Cardiovascular: Heart size is normal. No pericardial effusion. No aortic pathology. Pulmonary arterial opacification is good. There is some breathing motion. There are numerous small bilateral pulmonary emboli. Mediastinum/Nodes: Mild reactive nodal prominence in the hila and mediastinal region. Lungs/Pleura: The patient has widespread focal pulmonary infiltrates throughout both lungs. Pattern is different than usually seen with coronavirus pneumonia, in that many of the infiltrates are discrete and rounded. Whereas this could be an atypical manifestation of coronavirus pneumonia, other atypical pneumonias should be considered, particularly in the setting adenopathy which does not usually occur with coronavirus infection. Upper Abdomen: Negative Musculoskeletal: Negative Review of the MIP images confirms the above findings. IMPRESSION: 1. Numerous small bilateral pulmonary emboli. 2. Widespread bilateral pulmonary infiltrates. The pattern is different than usually seen with coronavirus pneumonia, in that many of  the infiltrates are discrete and rounded. Whereas this could be an atypical manifestation of coronavirus pneumonia, other atypical pneumonias should be considered, particularly in the setting of adenopathy which does not usually occur with coronavirus infection. Electronically Signed   By: MNelson ChimesM.D.   On: 01/12/2020 14:33   DG Chest Port 1 View  Result Date: 01/12/2020 CLINICAL DATA:  Chest pain, cough and shortness of breath, COVID positive EXAM: PORTABLE CHEST 1 VIEW COMPARISON:  None. FINDINGS: Diffuse bilateral patchy mixed interstitial and airspace opacities predominantly in the mid and lower lungs compatible with pneumonia related to COVID. Overall low lung volumes. Normal heart size. No large effusion or pneumothorax. Trachea midline. No acute osseous finding. IMPRESSION: Bilateral mid and lower lung pneumonia pattern related to COVID. Electronically Signed   By: MJerilynn Mages  Shick M.D.   On: 01/12/2020 12:37    Procedures Procedures (including critical care time)  Medications Ordered in ED Medications  0.9 %  sodium chloride infusion (1,000 mLs Intravenous New Bag/Given 01/12/20 1251)  cefTRIAXone (ROCEPHIN) 1 g in sodium chloride 0.9 % 100 mL IVPB (1 g Intravenous New Bag/Given 01/12/20 1500)  azithromycin (ZITHROMAX) 500 mg in sodium chloride 0.9 % 250 mL IVPB (has no administration in time range)  heparin ADULT infusion 100 units/mL (25000 units/2556m (1,650 Units/hr Intravenous New Bag/Given 01/12/20 1509)  acetaminophen (TYLENOL) tablet 1,000 mg (1,000 mg Oral Given 01/12/20 1300)  iohexol (OMNIPAQUE) 350 MG/ML injection 80 mL (80 mLs Intravenous Contrast Given 01/12/20 1342)  insulin aspart (novoLOG) injection 8 Units (8 Units Subcutaneous Given 01/12/20 1459)  heparin bolus via infusion 4,000 Units (4,000 Units Intravenous Bolus from Bag 01/12/20 1503)    ED Course  I have reviewed the triage vital signs and the nursing notes.  Pertinent labs & imaging results that were available during my  care of the patient were reviewed by me and considered in my medical decision making (see chart for details).    MDM Rules/Calculators/A&P  Pt does have covid pneumonia as well as an atypical pneumonia.  He was given decadron by UC.  He was given remdesivir, rocephin, and zithromax in ed.  He has PE on CT scan, so he was started on heparin.  His O2 sats drop with exertion to the upper 80s here, so he was kept on 2L oxygen via [place].  O2 sats in the mid-90s.    Glucose 363.  He was given a dose of insulin in ED.  Pt d/w Dr. Denton Brick ([facility]) for admission.  CRITICAL CARE Performed by: Isla Pence   Total critical care time: 45 minutes  Critical care time was exclusive of separately billable procedures and treating other patients.  Critical care was necessary to treat or prevent imminent or life-threatening deterioration.  Critical care was time spent personally by me on the following activities: development of treatment plan with patient and/or surrogate as well as nursing, discussions with consultants, evaluation of patient's response to treatment, examination of patient, obtaining history from patient or surrogate, ordering and performing treatments and interventions, ordering and review of laboratory studies, ordering and review of radiographic studies, pulse oximetry and re-evaluation of patient's condition.  Darran Gleed was evaluated in Emergency Department on 01/12/2020 for the symptoms described in the history of present illness. He was evaluated in the context of the global COVID-19 pandemic, which necessitated consideration that the patient might be at risk for infection with the SARS-CoV-2 virus that causes COVID-19. Institutional protocols and algorithms that pertain to the evaluation of patients at risk for COVID-19 are in a state of rapid change based on information released by regulatory bodies including the CDC and federal and state organizations. These  policies and algorithms were followed during the patient's care in the ED. Final Clinical Impression(s) / ED Diagnoses Final diagnoses:  Pneumonia due to COVID-19 virus  Bilateral pulmonary embolism ([place])  Poorly controlled type 2 diabetes mellitus ([place])  Acute respiratory failure with hypoxia [facility])    Rx / DC Orders ED Discharge Orders    None       Isla Pence, MD 01/12/20 1521

## 2020-01-12 NOTE — H&P (Addendum)
History and Physical    Tyler Morris OIB:[id number] DOB: January 22, 1990 DOA: 01/12/2020  PCP: Dorena Dew, FNP   Patient coming from: Home  I have personally briefly reviewed patient's old medical records in [place]  Chief Complaint: SOB  HPI: Tyler Morris is a 30 y.o. male with medical history significant for diabetes mellitus, seizures.  Patient was diagnosed with Covid on 2 January.  Over the past 2 days he has been having increasing difficulty breathing, distant cough and some dizziness.  Patient is not vaccinated.  Patient did not take any of his medications today, and yesterday he took only Metformin.   Patient lives alone, but patient's mother (unknown if she had Covid) fell in bathroom 1/6, sustaining a head bleed and passed away. Patient presented to the urgent care today, his O2 sats dropped to 82% on room air, was given 10 mg of IM Decadron and reffered to the ED.  ED Course: O2 sats with ambulation drops to 80s.  At rest O2 sats 91 to 95%. Chest xray- Bilateral mid and lower lung pneumonia pattern related to COVID.  Serum bicarb 17, anion gap of 16, blood glucose 363.  D-dimer 4.24.  Procalcitonin 0.23.  WBC 18.2.  CT chest shows numerous small bilateral PE, widespread bilateral pulmonary infiltrates different from that usually seen with Covid pneumonia, possible atypical manifestation of Covid pneumonia or other atypical pneumonias. IV ceftriaxone, azithromycin, heparin drip started in ED.  Hospitalist to admit for Covid pneumonia, and pulmonary embolism.  Review of Systems: As per HPI all other systems reviewed and negative.  Past Medical History:  Diagnosis Date  . Diabetes mellitus without complication ([place])   . Seizures ([place])     History reviewed. No pertinent surgical history.   reports that he has never smoked. He has never used smokeless tobacco. He reports current alcohol use. He reports that he does not use drugs.  No Known Allergies  Family History   Problem Relation Age of Onset  . Hypertension Mother   . Diabetes Maternal Grandfather   . Hypertension Maternal Grandfather   . Hypertension Maternal Grandmother   . Hyperlipidemia Maternal Grandmother     Prior to Admission medications   Medication Sig Start Date End Date Taking? Authorizing Provider  blood glucose meter kit and supplies Dispense based on patient and insurance preference. Use up to four times daily as directed. (FOR ICD-9 250.00, 250.01). 04/26/16   Elgergawy, Silver Huguenin, MD  CINNAMON PO Take by mouth.    [provider]  glipiZIDE (GLUCOTROL) 10 MG tablet Take 1 tablet (10 mg total) by mouth 2 (two) times daily before a meal. 04/26/16   Elgergawy, Silver Huguenin, MD  hydrochlorothiazide (MICROZIDE) 12.5 MG capsule Take 1 capsule (12.5 mg total) by mouth daily. 04/27/16   Elgergawy, Silver Huguenin, MD  lisinopril (PRINIVIL,ZESTRIL) 5 MG tablet Take 1 tablet (5 mg total) by mouth daily. 04/27/16   Elgergawy, Silver Huguenin, MD  metFORMIN (GLUCOPHAGE) 1000 MG tablet Take 1 tablet (1,000 mg total) by mouth 2 (two) times daily with a meal. 04/26/16   Elgergawy, Silver Huguenin, MD  sitaGLIPtin (JANUVIA) 50 MG tablet Take 1 tablet (50 mg total) by mouth daily. 05/04/16   Dorena Dew, FNP    Physical Exam: Vitals:   01/12/20 1006 01/12/20 1245 01/12/20 1430  BP: (!) 157/96 120/88 (!) 131/92  Pulse: (!) 130 (!) 113 (!) 104  Resp: 20 11 17   Temp: 99.6 F (37.6 C)    TempSrc: Oral  SpO2: 91% 94% 95%  Weight: 136.1 kg    Height: 5' 9"  (1.753 m)      Constitutional: NAD, calm, comfortable Vitals:   01/12/20 1006 01/12/20 1245 01/12/20 1430  BP: (!) 157/96 120/88 (!) 131/92  Pulse: (!) 130 (!) 113 (!) 104  Resp: 20 11 17   Temp: 99.6 F (37.6 C)    TempSrc: Oral    SpO2: 91% 94% 95%  Weight: 136.1 kg    Height: 5' 9"  (1.753 m)     Eyes: PERRL, lids and conjunctivae normal ENMT: Mucous membranes are moist. Neck: normal, supple, no masses, no thyromegaly Respiratory: Normal  respiratory effort. No accessory muscle use.  Cardiovascular: No extremity edema. 2+ pedal pulses.  Abdomen: no tenderness, no masses palpated. No hepatosplenomegaly. Bowel sounds positive.  Musculoskeletal: no clubbing / cyanosis. No joint deformity upper and lower extremities. Good ROM, no contractures. Normal muscle tone.  Skin: no rashes, lesions, ulcers. No induration Neurologic: No apparent cranial abnormalities, moving extremities spontaneously. Psychiatric: Normal judgment and insight. Alert and oriented x 3. Normal mood.   Labs on Admission: I have personally reviewed following labs and imaging studies  CBC: Recent Labs  Lab 01/12/20 1217  WBC 18.2*  NEUTROABS 13.7*  HGB 11.2*  HCT 34.2*  MCV 89.1  PLT 741   Basic Metabolic Panel: Recent Labs  Lab 01/12/20 1217  NA 131*  K 4.5  CL 98  CO2 17*  GLUCOSE 363*  BUN 9  CREATININE 0.91  CALCIUM 8.5*   Liver Function Tests: Recent Labs  Lab 01/12/20 1217  AST 37  ALT 44  ALKPHOS 70  BILITOT 1.5*  PROT 7.5  ALBUMIN 2.6*   Lipid Profile: Recent Labs    01/12/20 1217  TRIG 107    Radiological Exams on Admission: CT Angio Chest PE W and/or Wo Contrast  Result Date: 01/12/2020 CLINICAL DATA:  Coronavirus infection. Pulmonary embolism suspected. EXAM: CT ANGIOGRAPHY CHEST WITH CONTRAST TECHNIQUE: Multidetector CT imaging of the chest was performed using the standard protocol during bolus administration of intravenous contrast. Multiplanar CT image reconstructions and MIPs were obtained to evaluate the vascular anatomy. CONTRAST:  8m OMNIPAQUE IOHEXOL 350 MG/ML SOLN COMPARISON:  Radiography same day. FINDINGS: Cardiovascular: Heart size is normal. No pericardial effusion. No aortic pathology. Pulmonary arterial opacification is good. There is some breathing motion. There are numerous small bilateral pulmonary emboli. Mediastinum/Nodes: Mild reactive nodal prominence in the hila and mediastinal region. Lungs/Pleura:  The patient has widespread focal pulmonary infiltrates throughout both lungs. Pattern is different than usually seen with coronavirus pneumonia, in that many of the infiltrates are discrete and rounded. Whereas this could be an atypical manifestation of coronavirus pneumonia, other atypical pneumonias should be considered, particularly in the setting adenopathy which does not usually occur with coronavirus infection. Upper Abdomen: Negative Musculoskeletal: Negative Review of the MIP images confirms the above findings. IMPRESSION: 1. Numerous small bilateral pulmonary emboli. 2. Widespread bilateral pulmonary infiltrates. The pattern is different than usually seen with coronavirus pneumonia, in that many of the infiltrates are discrete and rounded. Whereas this could be an atypical manifestation of coronavirus pneumonia, other atypical pneumonias should be considered, particularly in the setting of adenopathy which does not usually occur with coronavirus infection. Electronically Signed   By: MNelson ChimesM.D.   On: 01/12/2020 14:33   DG Chest Port 1 View  Result Date: 01/12/2020 CLINICAL DATA:  Chest pain, cough and shortness of breath, COVID positive EXAM: PORTABLE CHEST 1 VIEW COMPARISON:  None.  FINDINGS: Diffuse bilateral patchy mixed interstitial and airspace opacities predominantly in the mid and lower lungs compatible with pneumonia related to COVID. Overall low lung volumes. Normal heart size. No large effusion or pneumothorax. Trachea midline. No acute osseous finding. IMPRESSION: Bilateral mid and lower lung pneumonia pattern related to COVID. Electronically Signed   By: Jerilynn Mages.  Shick M.D.   On: 01/12/2020 12:37    EKG: Independently reviewed.   Assessment/Plan Principal Problem:   Pneumonia due to COVID-19 virus Active Problems:   DKA (diabetic ketoacidosis) ([facility])   Seizures ([place])   Type 2 diabetes mellitus without complication ([place])   Muliple Small Pulmonary embolism ([facility])   Pneumonia due to  Covid 19 virus with acute hypoxic respiratory failure- O2 sats on room air 91 to 95%, but with ambulation O2 sats dropped to upper 80s in the ED. temperature 99.6.  D-dimer 4.24.  Patient also has significant leukocytosis of 18.2, with neutrophil predominance.  Procalcitonin 0.23.  CTA chest shows pneumonia not quite consistent with Covid, and multiple bilateral PE. -Remdesivir pharmacy to dose -Continue IV ceftriaxone and azithromycin. -COVID-19 admission protocol -Dexamethasone 10 mg x 1 given, continue with 74m daily -Flutter valve, incentive spirometry, inhalers, mucolytics as needed -Supplemental O2  Multiple small pulmonary embolism-in the setting of COVID-19 infection.  D-dimer 4.24. -IV heparin pharmacy to dose -Echocardiogram -Venous Dopplers bilateral  DKA-mild, but patient is currently on steroids which might aggravate this.  Serum bicarb 17, anion gap of 16, serum glucose 363.  BHA-elevated at 5.03.  - Obtain Venous blood gas - Insulin drip - 1 L RL bolus, continue RL 125cc/hr - BMP q4hr - HgbA1c -Hold Metformin, sitagliptin, glipizide,  History of seizures-diagnosed in 2018, seizure most likely provoked by hypoglycemia, so there was no indication to start antiepileptic medication.  HTN -systolic 1161Wto 1960A -Hold HCTZ while hydrating -Resume lisinopril pending med rec.   DVT prophylaxis:  Heparin Code Status: Full code Family Communication: None at bedside Disposition Plan: ~/> 2 days, pending improvement in respiratory status, and resolution of DKA. Consults called: None. Admission status: Inpt, Step down. I certify that at the point of admission it is my clinical judgment that the patient will require inpatient hospital care spanning beyond 2 midnights from the point of admission due to high intensity of service, high risk for further deterioration and high frequency of surveillance required.    EBethena RoysMD [facility]  01/12/2020, 3:22 PM

## 2020-01-12 NOTE — ED Triage Notes (Signed)
Pt presents to ED, sent from Urgent Care. Pt + covid on Tuesday. States O2 was low at Urgent Care. Pt denies SOB.

## 2020-01-13 ENCOUNTER — Inpatient Hospital Stay (HOSPITAL_COMMUNITY): Payer: Managed Care, Other (non HMO)

## 2020-01-13 DIAGNOSIS — J1282 Pneumonia due to coronavirus disease 2019: Secondary | ICD-10-CM

## 2020-01-13 DIAGNOSIS — R0602 Shortness of breath: Secondary | ICD-10-CM | POA: Diagnosis not present

## 2020-01-13 DIAGNOSIS — I2699 Other pulmonary embolism without acute cor pulmonale: Secondary | ICD-10-CM

## 2020-01-13 DIAGNOSIS — U071 COVID-19: Principal | ICD-10-CM | POA: Diagnosis present

## 2020-01-13 DIAGNOSIS — J9601 Acute respiratory failure with hypoxia: Secondary | ICD-10-CM

## 2020-01-13 DIAGNOSIS — E1310 Other specified diabetes mellitus with ketoacidosis without coma: Secondary | ICD-10-CM

## 2020-01-13 LAB — COMPREHENSIVE METABOLIC PANEL
ALT: 46 U/L — ABNORMAL HIGH (ref 0–44)
AST: 39 U/L (ref 15–41)
Albumin: 2.2 g/dL — ABNORMAL LOW (ref 3.5–5.0)
Alkaline Phosphatase: 56 U/L (ref 38–126)
Anion gap: 10 (ref 5–15)
BUN: 8 mg/dL (ref 6–20)
CO2: 22 mmol/L (ref 22–32)
Calcium: 8.5 mg/dL — ABNORMAL LOW (ref 8.9–10.3)
Chloride: 105 mmol/L (ref 98–111)
Creatinine, Ser: 0.56 mg/dL — ABNORMAL LOW (ref 0.61–1.24)
GFR, Estimated: >60 mL/min (ref >60)
Glucose, Bld: 201 mg/dL — ABNORMAL HIGH (ref 70–99)
Potassium: 3.7 mmol/L (ref 3.5–5.1)
Sodium: 137 mmol/L (ref 135–145)
Total Bilirubin: 0.7 mg/dL (ref 0.3–1.2)
Total Protein: 6.9 g/dL (ref 6.5–8.1)

## 2020-01-13 LAB — ECHOCARDIOGRAM COMPLETE
AR max vel: 3.21 cm2
AV Area VTI: 4.09 cm2
AV Area mean vel: 3.35 cm2
AV Mean grad: 4.2 mmHg
AV Peak grad: 7.1 mmHg
Ao pk vel: 1.33 m/s
Area-P 1/2: 3.37 cm2
Height: 69 in
S' Lateral: 2 cm
Weight: 4800 oz

## 2020-01-13 LAB — CBC
HCT: 30 % — ABNORMAL LOW (ref 39.0–52.0)
Hemoglobin: 9.8 g/dL — ABNORMAL LOW (ref 13.0–17.0)
MCH: 29 pg (ref 26.0–34.0)
MCHC: 32.7 g/dL (ref 30.0–36.0)
MCV: 88.8 fL (ref 80.0–100.0)
Platelets: 288 10*3/uL (ref 150–400)
RBC: 3.38 MIL/uL — ABNORMAL LOW (ref 4.22–5.81)
RDW: 12.7 % (ref 11.5–15.5)
WBC: 14.5 10*3/uL — ABNORMAL HIGH (ref 4.0–10.5)
nRBC: 0.1 % (ref 0.0–0.2)

## 2020-01-13 LAB — CBG MONITORING, ED
Glucose-Capillary: 153 mg/dL — ABNORMAL HIGH (ref 70–99)
Glucose-Capillary: 168 mg/dL — ABNORMAL HIGH (ref 70–99)
Glucose-Capillary: 169 mg/dL — ABNORMAL HIGH (ref 70–99)
Glucose-Capillary: 187 mg/dL — ABNORMAL HIGH (ref 70–99)
Glucose-Capillary: 187 mg/dL — ABNORMAL HIGH (ref 70–99)
Glucose-Capillary: 188 mg/dL — ABNORMAL HIGH (ref 70–99)
Glucose-Capillary: 188 mg/dL — ABNORMAL HIGH (ref 70–99)
Glucose-Capillary: 201 mg/dL — ABNORMAL HIGH (ref 70–99)
Glucose-Capillary: 201 mg/dL — ABNORMAL HIGH (ref 70–99)
Glucose-Capillary: 203 mg/dL — ABNORMAL HIGH (ref 70–99)
Glucose-Capillary: 213 mg/dL — ABNORMAL HIGH (ref 70–99)
Glucose-Capillary: 214 mg/dL — ABNORMAL HIGH (ref 70–99)
Glucose-Capillary: 214 mg/dL — ABNORMAL HIGH (ref 70–99)
Glucose-Capillary: 220 mg/dL — ABNORMAL HIGH (ref 70–99)
Glucose-Capillary: 226 mg/dL — ABNORMAL HIGH (ref 70–99)
Glucose-Capillary: 229 mg/dL — ABNORMAL HIGH (ref 70–99)
Glucose-Capillary: 256 mg/dL — ABNORMAL HIGH (ref 70–99)
Glucose-Capillary: 277 mg/dL — ABNORMAL HIGH (ref 70–99)
Glucose-Capillary: 307 mg/dL — ABNORMAL HIGH (ref 70–99)

## 2020-01-13 LAB — BASIC METABOLIC PANEL
Anion gap: 11 (ref 5–15)
BUN: 7 mg/dL (ref 6–20)
CO2: 17 mmol/L — ABNORMAL LOW (ref 22–32)
Calcium: 7.5 mg/dL — ABNORMAL LOW (ref 8.9–10.3)
Chloride: 102 mmol/L (ref 98–111)
Creatinine, Ser: 0.74 mg/dL (ref 0.61–1.24)
GFR, Estimated: >60 mL/min (ref >60)
Glucose, Bld: 231 mg/dL — ABNORMAL HIGH (ref 70–99)
Potassium: 3.8 mmol/L (ref 3.5–5.1)
Sodium: 130 mmol/L — ABNORMAL LOW (ref 135–145)

## 2020-01-13 LAB — D-DIMER, QUANTITATIVE: D-Dimer, Quant: 2.89 ug/mL-FEU — ABNORMAL HIGH (ref 0.00–0.50)

## 2020-01-13 LAB — C-REACTIVE PROTEIN: CRP: 40.2 mg/dL — ABNORMAL HIGH (ref <1.0)

## 2020-01-13 LAB — HEPARIN LEVEL (UNFRACTIONATED)
Heparin Unfractionated: 0.21 IU/mL — ABNORMAL LOW (ref 0.30–0.70)
Heparin Unfractionated: 0.26 IU/mL — ABNORMAL LOW (ref 0.30–0.70)

## 2020-01-13 LAB — BETA-HYDROXYBUTYRIC ACID: Beta-Hydroxybutyric Acid: 1.15 mmol/L — ABNORMAL HIGH (ref 0.05–0.27)

## 2020-01-13 LAB — HIV ANTIBODY (ROUTINE TESTING W REFLEX): HIV Screen 4th Generation wRfx: NONREACTIVE

## 2020-01-13 LAB — FERRITIN: Ferritin: 1932 ng/mL — ABNORMAL HIGH (ref 24–336)

## 2020-01-13 MED ORDER — INSULIN GLARGINE 100 UNIT/ML ~~LOC~~ SOLN
20.0000 [IU] | SUBCUTANEOUS | Status: DC
  Administered 2020-01-13: 20 [IU] via SUBCUTANEOUS
  Filled 2020-01-13 (×3): qty 0.2

## 2020-01-13 MED ORDER — APIXABAN 5 MG PO TABS
10.0000 mg | ORAL_TABLET | Freq: Two times a day (BID) | ORAL | Status: DC
  Administered 2020-01-13 – 2020-01-14 (×2): 10 mg via ORAL
  Filled 2020-01-13 (×2): qty 2

## 2020-01-13 MED ORDER — HEPARIN BOLUS VIA INFUSION
1500.0000 [IU] | Freq: Once | INTRAVENOUS | Status: AC
  Administered 2020-01-13: 1500 [IU] via INTRAVENOUS

## 2020-01-13 MED ORDER — APIXABAN 5 MG PO TABS: 5.0000 mg | ORAL_TABLET | Freq: Two times a day (BID) | ORAL | Status: DC

## 2020-01-13 MED ORDER — INSULIN ASPART 100 UNIT/ML ~~LOC~~ SOLN
0.0000 [IU] | Freq: Three times a day (TID) | SUBCUTANEOUS | Status: DC
  Administered 2020-01-13: 4 [IU] via SUBCUTANEOUS
  Administered 2020-01-14: 20 [IU] via SUBCUTANEOUS
  Administered 2020-01-14: 15 [IU] via SUBCUTANEOUS
  Filled 2020-01-13: qty 1

## 2020-01-13 MED ORDER — INSULIN ASPART 100 UNIT/ML ~~LOC~~ SOLN
0.0000 [IU] | Freq: Every day | SUBCUTANEOUS | Status: DC
  Administered 2020-01-13: 4 [IU] via SUBCUTANEOUS
  Filled 2020-01-13: qty 1

## 2020-01-13 MED ORDER — APIXABAN 5 MG PO TABS: 10.0000 mg | ORAL_TABLET | Freq: Two times a day (BID) | ORAL | Status: DC

## 2020-01-13 NOTE — ED Notes (Signed)
Called pharm for Lantus, pharm aware of need.

## 2020-01-13 NOTE — Progress Notes (Signed)
Patient wanting to be discharged tonight. I explained the severity of his comorbid conditions, and the high risk of worsening disease or possibly death if he leaves tonight. I did explain that ultimately it is his choice, but if he leaves tonight it will be against medical advice. Patient has agreed to stay through the night.

## 2020-01-13 NOTE — Progress Notes (Signed)
ANTICOAGULATION CONSULT NOTE -   Pharmacy Consult for heparin gtt  Indication: pulmonary embolus  No Known Allergies  Patient Measurements: Height: 5\' 9"  (175.3 cm) Weight: 136.1 kg (300 lb) IBW/kg (Calculated) : 70.7  HEPARIN DW (KG): 102.7  Vital Signs: BP: 129/90 (01/10 1700) Pulse Rate: 101 (01/10 1700)  Labs: Recent Labs    01/12/20 1217 01/12/20 2131 01/13/20 0148 01/13/20 0629 01/13/20 1613  HGB 11.2*  --   --  9.8*  --   HCT 34.2*  --   --  30.0*  --   PLT 268  --   --  288  --   HEPARINUNFRC  --  0.10*  --  0.21* 0.26*  CREATININE 0.91 0.83 0.74 0.56*  --     Estimated Creatinine Clearance: 190.1 mL/min (A) (by C-G formula based on SCr of 0.56 mg/dL (L)).   Medical History: Past Medical History:  Diagnosis Date  . Diabetes mellitus without complication (HCC)   . Seizures (HCC)     Medications:  (Not in a hospital admission)  Scheduled:  . albuterol  2 puff Inhalation Q6H  . vitamin C  500 mg Oral Daily  . dexamethasone (DECADRON) injection  6 mg Intravenous Q24H  . insulin aspart  0-20 Units Subcutaneous TID WC  . insulin aspart  0-5 Units Subcutaneous QHS  . insulin glargine  20 Units Subcutaneous Q24H  . zinc sulfate  220 mg Oral Daily   Infusions:  . azithromycin Stopped (01/13/20 1536)  . cefTRIAXone (ROCEPHIN)  IV Stopped (01/13/20 1417)  . dextrose 5% lactated ringers 125 mL/hr at 01/13/20 0316  . heparin 2,100 Units/hr (01/13/20 0934)  . insulin Stopped (01/13/20 1550)  . lactated ringers    . remdesivir 100 mg in NS 100 mL Stopped (01/13/20 1049)   PRN:  Anti-infectives (From admission, onward)   Start     Dose/Rate Route Frequency Ordered Stop   01/13/20 1400  cefTRIAXone (ROCEPHIN) 1 g in sodium chloride 0.9 % 100 mL IVPB        1 g 200 mL/hr over 30 Minutes Intravenous Every 24 hours 01/12/20 1808     01/13/20 1000  remdesivir 100 mg in sodium chloride 0.9 % 100 mL IVPB       "Followed by" Linked Group Details   100 mg 200  mL/hr over 30 Minutes Intravenous Daily 01/12/20 1624 01/17/20 0959   01/12/20 1630  remdesivir 100 mg in sodium chloride 0.9 % 100 mL IVPB       "Followed by" Linked Group Details   100 mg 200 mL/hr over 30 Minutes Intravenous Every 30 min 01/12/20 1624 01/12/20 1831   01/12/20 1445  cefTRIAXone (ROCEPHIN) 1 g in sodium chloride 0.9 % 100 mL IVPB        1 g 200 mL/hr over 30 Minutes Intravenous  Once 01/12/20 1443 01/12/20 1546   01/12/20 1445  azithromycin (ZITHROMAX) 500 mg in sodium chloride 0.9 % 250 mL IVPB        500 mg 250 mL/hr over 60 Minutes Intravenous Every 24 hours 01/12/20 1443        Assessment: Delon 03/11/20 a 30 y.o. male requires anticoagulation with a heparin iv infusion for the indication of  pulmonary embolus. Heparin gtt will be started following pharmacy protocol per pharmacy consult. Patient is not on previous oral anticoagulant   Heparin level 0.1> 0.21> 0.26, Subtherapeutic   Goal of Therapy:  Heparin level 0.3-0.7 units/ml Monitor platelets by anticoagulation protocol: Yes  Plan:  Give 1500 units bolus x 1 Increase heparin infusion at 2400 units/hr Check anti-Xa level in 6 hours and daily while on heparin Continue to monitor H&H and platelets  Elder Cyphers, BS Loura Back, BCPS Clinical Pharmacist Pager [phone] 01/13/2020,5:16 PM

## 2020-01-13 NOTE — Progress Notes (Signed)
ANTICOAGULATION CONSULT NOTE -   Pharmacy Consult for heparin>apixaban Indication: pulmonary embolus  No Known Allergies  Patient Measurements: Height: 5\' 9"  (175.3 cm) Weight: 136.1 kg (300 lb) IBW/kg (Calculated) : 70.7  HEPARIN DW (KG): 102.7  Vital Signs: BP: 114/73 (01/10 1830) Pulse Rate: 125 (01/10 1830)  Labs: Recent Labs    01/12/20 1217 01/12/20 2131 01/13/20 0148 01/13/20 0629 01/13/20 1613  HGB 11.2*  --   --  9.8*  --   HCT 34.2*  --   --  30.0*  --   PLT 268  --   --  288  --   HEPARINUNFRC  --  0.10*  --  0.21* 0.26*  CREATININE 0.91 0.83 0.74 0.56*  --     Estimated Creatinine Clearance: 190.1 mL/min (A) (by C-G formula based on SCr of 0.56 mg/dL (L)).   Medical History: Past Medical History:  Diagnosis Date  . Diabetes mellitus without complication (HCC)   . Seizures (HCC)     Medications:  (Not in a hospital admission)  Scheduled:  . albuterol  2 puff Inhalation Q6H  . apixaban  10 mg Oral BID  . vitamin C  500 mg Oral Daily  . dexamethasone (DECADRON) injection  6 mg Intravenous Q24H  . insulin aspart  0-20 Units Subcutaneous TID WC  . insulin aspart  0-5 Units Subcutaneous QHS  . insulin glargine  20 Units Subcutaneous Q24H  . zinc sulfate  220 mg Oral Daily   Infusions:  . azithromycin Stopped (01/13/20 1536)  . cefTRIAXone (ROCEPHIN)  IV Stopped (01/13/20 1417)  . dextrose 5% lactated ringers Stopped (01/13/20 1907)  . insulin Stopped (01/13/20 1550)  . lactated ringers    . remdesivir 100 mg in NS 100 mL Stopped (01/13/20 1049)   PRN:  Anti-infectives (From admission, onward)   Start     Dose/Rate Route Frequency Ordered Stop   01/13/20 1400  cefTRIAXone (ROCEPHIN) 1 g in sodium chloride 0.9 % 100 mL IVPB        1 g 200 mL/hr over 30 Minutes Intravenous Every 24 hours 01/12/20 1808     01/13/20 1000  remdesivir 100 mg in sodium chloride 0.9 % 100 mL IVPB       "Followed by" Linked Group Details   100 mg 200 mL/hr over 30  Minutes Intravenous Daily 01/12/20 1624 01/17/20 0959   01/12/20 1630  remdesivir 100 mg in sodium chloride 0.9 % 100 mL IVPB       "Followed by" Linked Group Details   100 mg 200 mL/hr over 30 Minutes Intravenous Every 30 min 01/12/20 1624 01/12/20 1831   01/12/20 1445  cefTRIAXone (ROCEPHIN) 1 g in sodium chloride 0.9 % 100 mL IVPB        1 g 200 mL/hr over 30 Minutes Intravenous  Once 01/12/20 1443 01/12/20 1546   01/12/20 1445  azithromycin (ZITHROMAX) 500 mg in sodium chloride 0.9 % 250 mL IVPB        500 mg 250 mL/hr over 60 Minutes Intravenous Every 24 hours 01/12/20 1443        Assessment: Tyler Morris 03[birth year] a 30 y.o. male requires anticoagulation with a heparin iv infusion for the indication of  pulmonary embolus. Heparin gtt will be started following pharmacy protocol per pharmacy consult. Patient is not on previous oral anticoagulant   Heparin level 0.1> 0.21> 0.26, Subtherapeutic    Pt will be transitioned to PO apixaban now for his PE.   Scr<1 Hgb 9.8, plt wnl  Goal of Therapy:  Monitor platelets by anticoagulation protocol: Yes   Plan:  Dc heparin Apixaban 10mg  PO BID x7d then 5mg  PO BID  , PharmD, [place], AAHIVP, CPP Infectious Disease Pharmacist 01/13/2020 9:06 PM

## 2020-01-13 NOTE — ED Notes (Signed)
Attempted report x 2 

## 2020-01-13 NOTE — ED Notes (Signed)
US at bedside

## 2020-01-13 NOTE — ED Notes (Signed)
ECHO @ bedside

## 2020-01-13 NOTE — ED Notes (Signed)
Endotool indicated that CBG has been stable and transition of care should be initiated. MD Veneda Melter made aware & stated that he would need to see that pt before any new orders are placed.

## 2020-01-13 NOTE — Progress Notes (Signed)
*  PRELIMINARY RESULTS* Echocardiogram 2D Echocardiogram has been performed.  Stacey Drain 01/13/2020, 10:19 AM

## 2020-01-13 NOTE — Progress Notes (Signed)
PROGRESS NOTE    Tyler Morris  HYI:[id number] DOB: [birth year] DOA: 01/12/2020 PCP: Massie Maroon, FNP    Brief Narrative:  30 year old male with a history of diabetes, admitted to the hospital with COVID-19 pneumonia and pulmonary emboli. He was noted to be hypoxic on ambulation. Started on anticoagulation. There was also concern for superimposed bacterial infection in addition to Covid. He has been on remdesivir and steroids, and intravenous antibiotics   Assessment & Plan:   Principal Problem:   Pneumonia due to COVID-19 virus Active Problems:   DKA (diabetic ketoacidosis) (HCC)   Seizures (HCC)   Type 2 diabetes mellitus without complication (HCC)   Muliple Small Pulmonary embolism (HCC)   COVID-19   Acute respiratory failure with hypoxia -Secondary to COVID-19 pneumonia/PE -Oxygen saturations dropping into the 80s on room air -He is on minimal oxygen at rest -Continue to wean off as tolerated  COVID-19 pneumonia -Also concerns for superimposed bacterial component -He is on intravenous antibiotics with ceftriaxone and azithromycin -He is also been started on remdesivir and steroids -Continue pulmonary hygiene  Multiple small pulmonary emboli -Precipitated by COVID-19 -Started on intravenous heparin, transition to Eliquis -Echocardiogram unremarkable -Venous Dopplers negative for DVT  Diabetic ketoacidosis -Started on insulin infusion -Overall anion gap is closed -Transition to subcutaneous insulin -Holding oral agents such as Metformin and Januvia -Reports that his A1c is usually around 9-10  History of seizures -Diagnosed in the setting of hyperglycemia, not on any antiepileptic medications  Hypertension -Holding lisinopril and hydrochlorothiazide -Resume once blood pressures are appropriate   DVT prophylaxis: Eliquis  Code Status: Full code Family Communication: Discussed with patient Disposition Plan: Status is: Inpatient  Remains inpatient  appropriate because:Inpatient level of care appropriate due to severity of illness   Dispo: The patient is from: Home              Anticipated d/c is to: Home              Anticipated d/c date is: 1 day              Patient currently is not medically stable to d/c.         Consultants:     Procedures:   Echo  Antimicrobials:   Azithromycin 1/9 >  Ceftriaxone 1/10 >   Subjective: He is feeling better. No shortness of breath. He does have a mild cough  Objective: Vitals:   01/13/20 1700 01/13/20 1730 01/13/20 1800 01/13/20 1830  BP: 129/90 130/84 119/74 114/73  Pulse: (!) 101 100 (!) 117 (!) 125  Resp: (!) 37 (!) 47 19 (!) 24  Temp:      TempSrc:      SpO2: 92% 93% 92% 93%  Weight:      Height:        Intake/Output Summary (Last 24 hours) at 01/13/2020 2054 Last data filed at 01/13/2020 1554 Gross per 24 hour  Intake 742.93 ml  Output 375 ml  Net 367.93 ml   Filed Weights   01/12/20 1006  Weight: 136.1 kg    Examination:  General exam: Appears calm and comfortable  Respiratory system: Clear to auscultation. Respiratory effort normal. Cardiovascular system: S1 & S2 heard, RRR. No JVD, murmurs, rubs, gallops or clicks. No pedal edema. Gastrointestinal system: Abdomen is nondistended, soft and nontender. No organomegaly or masses felt. Normal bowel sounds heard. Central nervous system: Alert and oriented. No focal neurological deficits. Extremities: Symmetric 5 x 5 power. Skin: No rashes, lesions or ulcers Psychiatry:  Judgement and insight appear normal. Mood & affect appropriate.     Data Reviewed: I have personally reviewed following labs and imaging studies  CBC: Recent Labs  Lab 01/12/20 1217 01/13/20 0629  WBC 18.2* 14.5*  NEUTROABS 13.7*  --   HGB 11.2* 9.8*  HCT 34.2* 30.0*  MCV 89.1 88.8  PLT 268 288   Basic Metabolic Panel: Recent Labs  Lab 01/12/20 1217 01/12/20 2131 01/13/20 0148 01/13/20 0629  NA 131* 134* 130* 137  K  4.5 4.0 3.8 3.7  CL 98 103 102 105  CO2 17* 15* 17* 22  GLUCOSE 363* 220* 231* 201*  BUN 9 8 7 8   CREATININE 0.91 0.83 0.74 0.56*  CALCIUM 8.5* 8.3* 7.5* 8.5*   GFR: Estimated Creatinine Clearance: 190.1 mL/min (A) (by C-G formula based on SCr of 0.56 mg/dL (L)). Liver Function Tests: Recent Labs  Lab 01/12/20 1217 01/13/20 0629  AST 37 39  ALT 44 46*  ALKPHOS 70 56  BILITOT 1.5* 0.7  PROT 7.5 6.9  ALBUMIN 2.6* 2.2*   No results for input(s): LIPASE, AMYLASE in the last 168 hours. No results for input(s): AMMONIA in the last 168 hours. Coagulation Profile: No results for input(s): INR, PROTIME in the last 168 hours. Cardiac Enzymes: No results for input(s): CKTOTAL, CKMB, CKMBINDEX, TROPONINI in the last 168 hours. BNP (last 3 results) No results for input(s): PROBNP in the last 8760 hours. HbA1C: Recent Labs    01/12/20 1510  HGBA1C 9.7*   CBG: Recent Labs  Lab 01/13/20 1300 01/13/20 1343 01/13/20 1447 01/13/20 1550 01/13/20 1641  GLUCAP 201* 188* 187* 153* 169*   Lipid Profile: Recent Labs    01/12/20 1217  TRIG 107   Thyroid Function Tests: No results for input(s): TSH, T4TOTAL, FREET4, T3FREE, THYROIDAB in the last 72 hours. Anemia Panel: Recent Labs    01/12/20 1217 01/13/20 0629  FERRITIN 1,855* 1,932*   Sepsis Labs: Recent Labs  Lab 01/12/20 1217 01/12/20 1626  PROCALCITON 0.23  --   LATICACIDVEN 1.3 1.0    Recent Results (from the past 240 hour(s))  Novel Coronavirus, NAA ([facility])     Status: Abnormal   Collection Time: 01/05/20  4:38 PM   Specimen: Nasopharyngeal(NP) swabs in vial transport medium   Nasopharynge  Patient  Result Value Ref Range Status   SARS-CoV-2, NAA Detected (A) Not Detected Final    Comment: Patients who have a positive COVID-19 test result may now have treatment options. Treatment options are available for patients with mild to moderate symptoms and for hospitalized patients. Visit our website at  [email or web address] for resources and information. This nucleic acid amplification test was developed and its performance characteristics determined by [facility]. Nucleic acid amplification tests include RT-PCR and TMA. This test has not been FDA cleared or approved. This test has been authorized by FDA under an Emergency Use Authorization (EUA). This test is only authorized for the duration of time the declaration that circumstances exist justifying the authorization of the emergency use of in vitro diagnostic tests for detection of SARS-CoV-2 virus and/or diagnosis of COVID-19 infection under section 564(b)(1) of the Act, 21 U.S.C. 161WRU-0(A360bbb-3(b) (1), unless the authorization is terminated or revoked sooner. When diagnostic testing is negativ e, the possibility of a false negative result should be considered in the context of a patient's recent exposures and the presence of clinical signs and symptoms consistent with COVID-19. An individual without symptoms of COVID-19 and who is not shedding SARS-CoV-2 virus would expect  to have a negative (not detected) result in this assay.   SARS-COV-2, NAA 2 DAY TAT     Status: None   Collection Time: 01/05/20  4:38 PM   Nasopharynge  Patient  Result Value Ref Range Status   SARS-CoV-2, NAA 2 DAY TAT Performed  Final  Blood Culture (routine x 2)     Status: None (Preliminary result)   Collection Time: 01/12/20 12:17 PM   Specimen: BLOOD  Result Value Ref Range Status   Specimen Description   Final    BLOOD RIGHT ANTECUBITAL Performed at [facility], [address], [place], [place] [address]    Special Requests   Final    BOTTLES DRAWN AEROBIC AND ANAEROBIC Blood Culture adequate volume Performed at [facility], [address], [place], [place] [address]    Culture   Final    NO GROWTH 1 DAY Performed at [facility], [address] [place], [place] [address]     Report Status PENDING  Incomplete  Blood Culture (routine x 2)     Status: None (Preliminary result)   Collection Time: 01/12/20 12:55 PM   Specimen: BLOOD  Result Value Ref Range Status   Specimen Description   Final    BLOOD LEFT ANTECUBITAL Performed at [facility], [address], [place], [place] [address]    Special Requests   Final    BOTTLES DRAWN AEROBIC AND ANAEROBIC Blood Culture adequate volume Performed at [facility], [address], [place], [place] [address]    Culture   Final    NO GROWTH 1 DAY Performed at [facility], [address], [place], [place] [address]    Report Status PENDING  Incomplete         Radiology Studies: CT Angio Chest PE W and/or Wo Contrast  Result Date: 01/12/2020 CLINICAL DATA:  Coronavirus infection. Pulmonary embolism suspected. EXAM: CT ANGIOGRAPHY CHEST WITH CONTRAST TECHNIQUE: Multidetector CT imaging of the chest was performed using the standard protocol during bolus administration of intravenous contrast. Multiplanar CT image reconstructions and MIPs were obtained to evaluate the vascular anatomy. CONTRAST:  83mL OMNIPAQUE IOHEXOL 350 MG/ML SOLN COMPARISON:  Radiography same day. FINDINGS: Cardiovascular: Heart size is normal. No pericardial effusion. No aortic pathology. Pulmonary arterial opacification is good. There is some breathing motion. There are numerous small bilateral pulmonary emboli. Mediastinum/Nodes: Mild reactive nodal prominence in the hila and mediastinal region. Lungs/Pleura: The patient has widespread focal pulmonary infiltrates throughout both lungs. Pattern is different than usually seen with coronavirus pneumonia, in that many of the infiltrates are discrete and rounded. Whereas this could be an atypical manifestation of coronavirus pneumonia, other atypical pneumonias should be considered, particularly in the setting adenopathy which does not usually occur with  coronavirus infection. Upper Abdomen: Negative Musculoskeletal: Negative Review of the MIP images confirms the above findings. IMPRESSION: 1. Numerous small bilateral pulmonary emboli. 2. Widespread bilateral pulmonary infiltrates. The pattern is different than usually seen with coronavirus pneumonia, in that many of the infiltrates are discrete and rounded. Whereas this could be an atypical manifestation of coronavirus pneumonia, other atypical pneumonias should be considered, particularly in the setting of adenopathy which does not usually occur with coronavirus infection. Electronically Signed   By: Paulina Fusi M.D.   On: 01/12/2020 14:33   US Venous Img Lower Bilateral (DVT)  Result Date: 01/13/2020 CLINICAL DATA:  Shortness of breath, pulmonary emboli EXAM: BILATERAL LOWER EXTREMITY VENOUS DOPPLER  ULTRASOUND TECHNIQUE: Gray-scale sonography with graded compression, as well as color Doppler and duplex ultrasound were performed to evaluate the lower extremity deep venous systems from the level of the common femoral vein and including the common femoral, femoral, profunda femoral, popliteal and calf veins including the posterior tibial, peroneal and gastrocnemius veins when visible. The superficial great saphenous vein was also interrogated. Spectral Doppler was utilized to evaluate flow at rest and with distal augmentation maneuvers in the common femoral, femoral and popliteal veins. COMPARISON:  None. FINDINGS: Technical note: Examination is slightly limited secondary to poor penetration related to body habitus. RIGHT LOWER EXTREMITY Common Femoral Vein: No evidence of thrombus. Normal compressibility, respiratory phasicity and response to augmentation. Saphenofemoral Junction: No evidence of thrombus. Normal compressibility and flow on color Doppler imaging. Profunda Femoral Vein: No evidence of thrombus. Normal compressibility and flow on color Doppler imaging. Femoral Vein: No evidence of thrombus.  Normal compressibility, respiratory phasicity and response to augmentation. Popliteal Vein: No evidence of thrombus. Normal compressibility, respiratory phasicity and response to augmentation. Calf Veins: No evidence of thrombus. Normal compressibility and flow on color Doppler imaging. Superficial Great Saphenous Vein: No evidence of thrombus. Normal compressibility. Venous Reflux:  None. Other Findings:  None. LEFT LOWER EXTREMITY Common Femoral Vein: No evidence of thrombus. Normal compressibility, respiratory phasicity and response to augmentation. Saphenofemoral Junction: No evidence of thrombus. Normal compressibility and flow on color Doppler imaging. Profunda Femoral Vein: No evidence of thrombus. Normal compressibility and flow on color Doppler imaging. Femoral Vein: No evidence of thrombus. Normal compressibility, respiratory phasicity and response to augmentation. Popliteal Vein: No evidence of thrombus. Normal compressibility, respiratory phasicity and response to augmentation. Calf Veins: No evidence of thrombus. Normal compressibility and flow on color Doppler imaging. Superficial Great Saphenous Vein: No evidence of thrombus. Normal compressibility. Venous Reflux:  None. Other Findings:  None. IMPRESSION: No evidence of deep venous thrombosis in either lower extremity. Electronically Signed   By: Duanne GuessNicholas  Plundo D.O.   On: 01/13/2020 09:18   DG Chest Port 1 View  Result Date: 01/12/2020 CLINICAL DATA:  Chest pain, cough and shortness of breath, COVID positive EXAM: PORTABLE CHEST 1 VIEW COMPARISON:  None. FINDINGS: Diffuse bilateral patchy mixed interstitial and airspace opacities predominantly in the mid and lower lungs compatible with pneumonia related to COVID. Overall low lung volumes. Normal heart size. No large effusion or pneumothorax. Trachea midline. No acute osseous finding. IMPRESSION: Bilateral mid and lower lung pneumonia pattern related to COVID. Electronically Signed   By: Judie PetitM.  Shick  M.D.   On: 01/12/2020 12:37   ECHOCARDIOGRAM COMPLETE  Result Date: 01/13/2020    ECHOCARDIOGRAM REPORT   Patient Name:   Tyler NaasDEREL Warf Date of Exam: 01/13/2020 Medical Rec #:  [id number]    Height:       69.0 in Accession #:    [id number]   Weight:       300.0 lb Date of Birth:  22-Jul-1990    BSA:          2.455 m Patient Age:    29 years     BP:           133/87 mmHg Patient Gender: M            HR:           93 bpm. [facility] Procedure: 2D Echo, Cardiac Doppler and Color Doppler Indications:    Pulmonary Embolus 415.19 / I26.99  History:  Patient has no prior history of Echocardiogram examinations.                 Risk Factors:Diabetes. Pneumonia due to COVID-19 virus, Morbid                 Obesity.  Sonographer:    Celesta Gentile RCS Referring Phys: [phone] Heloise Beecham EMOKPAE IMPRESSIONS  1. Left ventricular ejection fraction, by estimation, is 70 to 75%. The left ventricle has hyperdynamic function. The left ventricle has no regional wall motion abnormalities. Left ventricular diastolic parameters were normal.  2. Right ventricular systolic function is normal. The right ventricular size is normal.  3. The mitral valve is normal in structure. Trivial mitral valve regurgitation. No evidence of mitral stenosis.  4. The aortic valve is tricuspid. Aortic valve regurgitation is not visualized. No aortic stenosis is present.  5. The inferior vena cava is normal in size with <50% respiratory variability, suggesting right atrial pressure of 8 mmHg. FINDINGS  Left Ventricle: Left ventricular ejection fraction, by estimation, is 70 to 75%. The left ventricle has hyperdynamic function. The left ventricle has no regional wall motion abnormalities. The left ventricular internal cavity size was normal in size. There is no left ventricular hypertrophy. Left ventricular diastolic parameters were normal. Right Ventricle: The right ventricular size is normal. No increase in right ventricular wall thickness.  Right ventricular systolic function is normal. Left Atrium: Left atrial size was normal in size. Right Atrium: Right atrial size was normal in size. Pericardium: There is no evidence of pericardial effusion. Mitral Valve: The mitral valve is normal in structure. Trivial mitral valve regurgitation. No evidence of mitral valve stenosis. Tricuspid Valve: The tricuspid valve is normal in structure. Tricuspid valve regurgitation is not demonstrated. No evidence of tricuspid stenosis. Aortic Valve: The aortic valve is tricuspid. Aortic valve regurgitation is not visualized. No aortic stenosis is present. Aortic valve mean gradient measures 4.2 mmHg. Aortic valve peak gradient measures 7.1 mmHg. Aortic valve area, by VTI measures 4.09 cm. Pulmonic Valve: The pulmonic valve was not well visualized. Pulmonic valve regurgitation is not visualized. No evidence of pulmonic stenosis. Aorta: The aortic root is normal in size and structure. Pulmonary Artery: Indeterminant PASP, inadequate TR jet. Venous: The inferior vena cava is normal in size with less than 50% respiratory variability, suggesting right atrial pressure of 8 mmHg. IAS/Shunts: No atrial level shunt detected by color flow Doppler.  LEFT VENTRICLE PLAX 2D LVIDd:         4.70 cm  Diastology LVIDs:         2.00 cm  LV e' medial:    10.90 cm/s LV PW:         1.10 cm  LV E/e' medial:  10.6 LV IVS:        1.00 cm  LV e' lateral:   13.10 cm/s LVOT diam:     2.30 cm  LV E/e' lateral: 8.9 LV SV:         106 LV SV Index:   43 LVOT Area:     4.15 cm  RIGHT VENTRICLE RV S prime:     16.00 cm/s TAPSE (M-mode): 2.9 cm LEFT ATRIUM             Index       RIGHT ATRIUM           Index LA diam:        3.30 cm 1.34 cm/m  RA Area:     15.50 cm  LA Vol (A2C):   51.0 ml 20.78 ml/m RA Volume:   42.40 ml  17.27 ml/m LA Vol (A4C):   63.4 ml 25.83 ml/m LA Biplane Vol: 62.6 ml 25.50 ml/m  AORTIC VALVE AV Area (Vmax):    3.21 cm AV Area (Vmean):   3.35 cm AV Area (VTI):     4.09 cm  AV Vmax:           133.33 cm/s AV Vmean:          98.378 cm/s AV VTI:            0.260 m AV Peak Grad:      7.1 mmHg AV Mean Grad:      4.2 mmHg LVOT Vmax:         103.00 cm/s LVOT Vmean:        79.400 cm/s LVOT VTI:          0.256 m LVOT/AV VTI ratio: 0.99  AORTA Ao Root diam: 3.40 cm MITRAL VALVE MV Area (PHT): 3.37 cm     SHUNTS MV Decel Time: 225 msec     Systemic VTI:  0.26 m MV E velocity: 116.00 cm/s  Systemic Diam: 2.30 cm MV A velocity: 76.60 cm/s MV E/A ratio:  1.51 Dina Rich MD Electronically signed by Dina Rich MD Signature Date/Time: 01/13/2020/11:15:54 AM    Final         Scheduled Meds: . albuterol  2 puff Inhalation Q6H  . vitamin C  500 mg Oral Daily  . dexamethasone (DECADRON) injection  6 mg Intravenous Q24H  . insulin aspart  0-20 Units Subcutaneous TID WC  . insulin aspart  0-5 Units Subcutaneous QHS  . insulin glargine  20 Units Subcutaneous Q24H  . zinc sulfate  220 mg Oral Daily   Continuous Infusions: . azithromycin Stopped (01/13/20 1536)  . cefTRIAXone (ROCEPHIN)  IV Stopped (01/13/20 1417)  . dextrose 5% lactated ringers Stopped (01/13/20 1907)  . heparin 2,400 Units/hr (01/13/20 1732)  . insulin Stopped (01/13/20 1550)  . lactated ringers    . remdesivir 100 mg in NS 100 mL Stopped (01/13/20 1049)     LOS: 1 day    Time spent:    Erick Blinks, MD [facility]   If 7PM-7AM, please contact night-coverage [email or web address]  01/13/2020, 8:54 PM

## 2020-01-13 NOTE — Progress Notes (Signed)
ANTICOAGULATION CONSULT NOTE -   Pharmacy Consult for heparin gtt  Indication: pulmonary embolus  No Known Allergies  Patient Measurements: Height: 5\' 9"  (175.3 cm) Weight: 136.1 kg (300 lb) IBW/kg (Calculated) : 70.7  HEPARIN DW (KG): 102.7  Vital Signs: BP: 136/98 (01/10 0900) Pulse Rate: 81 (01/10 0900)  Labs: Recent Labs    01/12/20 1217 01/12/20 2131 01/13/20 0148 01/13/20 0629  HGB 11.2*  --   --  9.8*  HCT 34.2*  --   --  30.0*  PLT 268  --   --  288  HEPARINUNFRC  --  0.10*  --  0.21*  CREATININE 0.91 0.83 0.74 0.56*    Estimated Creatinine Clearance: 190.1 mL/min (A) (by C-G formula based on SCr of 0.56 mg/dL (L)).   Medical History: Past Medical History:  Diagnosis Date  . Diabetes mellitus without complication (HCC)   . Seizures (HCC)     Medications:  (Not in a hospital admission)  Scheduled:  . albuterol  2 puff Inhalation Q6H  . vitamin C  500 mg Oral Daily  . dexamethasone (DECADRON) injection  6 mg Intravenous Q24H  . zinc sulfate  220 mg Oral Daily   Infusions:  . azithromycin Stopped (01/12/20 1707)  . cefTRIAXone (ROCEPHIN)  IV    . dextrose 5% lactated ringers 125 mL/hr at 01/13/20 0316  . heparin 1,900 Units/hr (01/13/20 0315)  . insulin 9 Units/hr (01/13/20 03/12/20)  . lactated ringers    . remdesivir 100 mg in NS 100 mL     PRN:  Anti-infectives (From admission, onward)   Start     Dose/Rate Route Frequency Ordered Stop   01/13/20 1400  cefTRIAXone (ROCEPHIN) 1 g in sodium chloride 0.9 % 100 mL IVPB        1 g 200 mL/hr over 30 Minutes Intravenous Every 24 hours 01/12/20 1808     01/13/20 1000  remdesivir 100 mg in sodium chloride 0.9 % 100 mL IVPB       "Followed by" Linked Group Details   100 mg 200 mL/hr over 30 Minutes Intravenous Daily 01/12/20 1624 01/17/20 0959   01/12/20 1630  remdesivir 100 mg in sodium chloride 0.9 % 100 mL IVPB       "Followed by" Linked Group Details   100 mg 200 mL/hr over 30 Minutes Intravenous  Every 30 min 01/12/20 1624 01/12/20 1831   01/12/20 1445  cefTRIAXone (ROCEPHIN) 1 g in sodium chloride 0.9 % 100 mL IVPB        1 g 200 mL/hr over 30 Minutes Intravenous  Once 01/12/20 1443 01/12/20 1546   01/12/20 1445  azithromycin (ZITHROMAX) 500 mg in sodium chloride 0.9 % 250 mL IVPB        500 mg 250 mL/hr over 60 Minutes Intravenous Every 24 hours 01/12/20 1443        Assessment: Tyler Morris a 30 y.o. male requires anticoagulation with a heparin iv infusion for the indication of  pulmonary embolus. Heparin gtt will be started following pharmacy protocol per pharmacy consult. Patient is not on previous oral anticoagulant   Heparin level 0.1> 0.21, Subtherapeutic   Goal of Therapy:  Heparin level 0.3-0.7 units/ml Monitor platelets by anticoagulation protocol: Yes   Plan:  Give 1500 units bolus x 1 Increase heparin infusion at 2100 units/hr Check anti-Xa level in 6 hours and daily while on heparin Continue to monitor H&H and platelets  34, BS Elder Cyphers, BCPS Clinical Pharmacist Pager [phone] 01/13/2020,9:19 AM

## 2020-01-13 NOTE — ED Notes (Signed)
Pt given dinner meal tray

## 2020-01-13 NOTE — ED Notes (Signed)
Pt belongings placed in bags at bedside, pt moved from bed to recliner and bed sheets changed. Pt appreciative.

## 2020-01-13 NOTE — Progress Notes (Signed)
Inpatient Diabetes Program Recommendations  AACE/ADA: New Consensus Statement on Inpatient Glycemic Control (2015)  Target Ranges:  Prepandial:   less than 140 mg/dL      Peak postprandial:   less than 180 mg/dL (1-2 hours)      Critically ill patients:  140 - 180 mg/dL   Results for Tyler Morris, Tyler Morris (MRN [id number]) as of 01/13/2020 06:34  Ref. Range 01/12/2020 12:17  Sodium Latest Ref Range: 135 - 145 mmol/L 131 (L)  Potassium Latest Ref Range: 3.5 - 5.1 mmol/L 4.5  Chloride Latest Ref Range: 98 - 111 mmol/L 98  CO2 Latest Ref Range: 22 - 32 mmol/L 17 (L)  Glucose Latest Ref Range: 70 - 99 mg/dL 130 (H)  BUN Latest Ref Range: 6 - 20 mg/dL 9  Creatinine Latest Ref Range: 0.61 - 1.24 mg/dL 8.65  Calcium Latest Ref Range: 8.9 - 10.3 mg/dL 8.5 (L)  Anion gap Latest Ref Range: 5 - 15  16 (H)   Results for Tyler Morris, Tyler Morris (MRN [id number]) as of 01/13/2020 06:34  Ref. Range 01/12/2020 15:26  Beta-Hydroxybutyric Acid Latest Ref Range: 0.05 - 0.27 mmol/L 5.03 (H)   Results for Tyler Morris, Tyler Morris (MRN [id number]) as of 01/13/2020 06:34  Ref. Range 01/12/2020 15:10  Hemoglobin A1C Latest Ref Range: 4.8 - 5.6 % 9.7 (H)  (231 mg/dl)   Results for Tyler Morris, Tyler Morris (MRN [id number]) as of 01/13/2020 06:34  Ref. Range 01/12/2020 16:31 01/12/2020 17:50 01/12/2020 18:45 01/12/2020 19:46 01/12/2020 20:45 01/12/2020 21:57 01/12/2020 22:46 01/13/2020 00:02 01/13/2020 00:54 01/13/2020 02:04 01/13/2020 03:06 01/13/2020 04:14 01/13/2020 04:55 01/13/2020 05:48  Glucose-Capillary Latest Ref Range: 70 - 99 mg/dL 440 (H)  IV Insulin Drip Started 278 (H)  IV Insulin Drip 226 (H) 210 (H) 245 (H) 211 (H)  IV Insulin Drip 240 (H)  IV Insulin Drip 220 (H)  IV Insulin Drip 226 (H)  IV Insulin Drip 229 (H)  IV Insulin Drip 214 (H)  IV Insulin Drip 256 (H)  IV Insulin Drip 214 (H)  IV Insulin Drip 203 (H)  IV Insulin Drip    Admit with: Pneumonia due to Covid 19 virus with acute hypoxic respiratory failure/ Pulmonary Emboli/ DKA  History:  Diabetes  Home DM Meds: Glipizide 10 mg BID       Metformin 1000 mg BID       Januvia 50 mg Daily  Current Orders: IV Insulin Drip      Decadron 6 mg Daily     MD- Note 2am BMET showed the following: Glucose 231 CO2 level= 17 Anion Gap= 11  Would wait to transition pt to SQ Insulin when CO2 has reached 20 or higher.  When patient ready to transition to SQ Insulin, please consider the following using the COVID-19 glycemic control order set: 1. Start Levemir 14 units BID (0.1 units/kg BID) 2. Start Novolog Moderate Correction Scale/ SSI (0-15 units) Q4 hours     --Will follow patient during hospitalization--  Ambrose Finland RN, MSN, CDE Diabetes Coordinator Inpatient Glycemic Control Team Team Pager: [phone] (8a-5p)

## 2020-01-14 DIAGNOSIS — J9601 Acute respiratory failure with hypoxia: Secondary | ICD-10-CM | POA: Diagnosis not present

## 2020-01-14 DIAGNOSIS — E1310 Other specified diabetes mellitus with ketoacidosis without coma: Secondary | ICD-10-CM | POA: Diagnosis not present

## 2020-01-14 DIAGNOSIS — I2699 Other pulmonary embolism without acute cor pulmonale: Secondary | ICD-10-CM | POA: Diagnosis not present

## 2020-01-14 DIAGNOSIS — U071 COVID-19: Secondary | ICD-10-CM | POA: Diagnosis not present

## 2020-01-14 LAB — COMPREHENSIVE METABOLIC PANEL
ALT: 45 U/L — ABNORMAL HIGH (ref 0–44)
AST: 25 U/L (ref 15–41)
Albumin: 2.1 g/dL — ABNORMAL LOW (ref 3.5–5.0)
Alkaline Phosphatase: 56 U/L (ref 38–126)
Anion gap: 10 (ref 5–15)
BUN: 19 mg/dL (ref 6–20)
CO2: 22 mmol/L (ref 22–32)
Calcium: 8.4 mg/dL — ABNORMAL LOW (ref 8.9–10.3)
Chloride: 105 mmol/L (ref 98–111)
Creatinine, Ser: 0.81 mg/dL (ref 0.61–1.24)
GFR, Estimated: >60 mL/min (ref >60)
Glucose, Bld: 326 mg/dL — ABNORMAL HIGH (ref 70–99)
Potassium: 4.1 mmol/L (ref 3.5–5.1)
Sodium: 137 mmol/L (ref 135–145)
Total Bilirubin: 0.9 mg/dL (ref 0.3–1.2)
Total Protein: 6.5 g/dL (ref 6.5–8.1)

## 2020-01-14 LAB — C-REACTIVE PROTEIN: CRP: 19.2 mg/dL — ABNORMAL HIGH (ref <1.0)

## 2020-01-14 LAB — D-DIMER, QUANTITATIVE: D-Dimer, Quant: 2.53 ug/mL-FEU — ABNORMAL HIGH (ref 0.00–0.50)

## 2020-01-14 LAB — CBC
HCT: 30.5 % — ABNORMAL LOW (ref 39.0–52.0)
Hemoglobin: 9.6 g/dL — ABNORMAL LOW (ref 13.0–17.0)
MCH: 28.2 pg (ref 26.0–34.0)
MCHC: 31.5 g/dL (ref 30.0–36.0)
MCV: 89.7 fL (ref 80.0–100.0)
Platelets: 346 10*3/uL (ref 150–400)
RBC: 3.4 MIL/uL — ABNORMAL LOW (ref 4.22–5.81)
RDW: 12.7 % (ref 11.5–15.5)
WBC: 13.9 10*3/uL — ABNORMAL HIGH (ref 4.0–10.5)
nRBC: 0.1 % (ref 0.0–0.2)

## 2020-01-14 LAB — FERRITIN: Ferritin: 1916 ng/mL — ABNORMAL HIGH (ref 24–336)

## 2020-01-14 LAB — GLUCOSE, CAPILLARY
Glucose-Capillary: 324 mg/dL — ABNORMAL HIGH (ref 70–99)
Glucose-Capillary: 400 mg/dL — ABNORMAL HIGH (ref 70–99)

## 2020-01-14 MED ORDER — APIXABAN 5 MG PO TABS: ORAL_TABLET | ORAL | 1 refills | Status: DC

## 2020-01-14 MED ORDER — AZITHROMYCIN 500 MG PO TABS: 500.0000 mg | ORAL_TABLET | Freq: Every day | ORAL | 0 refills | Status: AC

## 2020-01-14 MED ORDER — PEN NEEDLES 30G X 8 MM MISC: 0 refills | Status: AC

## 2020-01-14 MED ORDER — ALBUTEROL SULFATE HFA 108 (90 BASE) MCG/ACT IN AERS: 2.0000 | INHALATION_SPRAY | Freq: Four times a day (QID) | RESPIRATORY_TRACT | Status: DC | PRN

## 2020-01-14 MED ORDER — LINAGLIPTIN 5 MG PO TABS
5.0000 mg | ORAL_TABLET | Freq: Every day | ORAL | Status: DC
  Administered 2020-01-14: 5 mg via ORAL
  Filled 2020-01-14: qty 1

## 2020-01-14 MED ORDER — BASAGLAR KWIKPEN 100 UNIT/ML ~~LOC~~ SOPN: 20.0000 [IU] | PEN_INJECTOR | Freq: Two times a day (BID) | SUBCUTANEOUS | 0 refills | Status: DC

## 2020-01-14 MED ORDER — INSULIN GLARGINE 100 UNIT/ML ~~LOC~~ SOLN
20.0000 [IU] | Freq: Two times a day (BID) | SUBCUTANEOUS | Status: DC
  Administered 2020-01-14: 20 [IU] via SUBCUTANEOUS
  Filled 2020-01-14 (×3): qty 0.2

## 2020-01-14 MED ORDER — INSULIN ASPART 100 UNIT/ML ~~LOC~~ SOLN
5.0000 [IU] | Freq: Three times a day (TID) | SUBCUTANEOUS | Status: DC
  Administered 2020-01-14: 5 [IU] via SUBCUTANEOUS

## 2020-01-14 MED ORDER — ALBUTEROL SULFATE HFA 108 (90 BASE) MCG/ACT IN AERS: 2.0000 | INHALATION_SPRAY | Freq: Four times a day (QID) | RESPIRATORY_TRACT | 0 refills | Status: DC | PRN

## 2020-01-14 MED ORDER — AMOXICILLIN-POT CLAVULANATE 875-125 MG PO TABS: 1.0000 | ORAL_TABLET | Freq: Two times a day (BID) | ORAL | 0 refills | Status: AC

## 2020-01-14 NOTE — Discharge Instructions (Signed)
Insulin Injection Instructions, Using Insulin Pens, Adult There are many different types of insulin. The type of insulin that you take may determine how many injections you give yourself and when you need to give the injections. Supplies needed:  Soap and water.  Your insulin pen.  A new needle.  Alcohol wipes.  A disposal container for sharp items (sharps container), such as an empty plastic bottle with a cover. How to choose a site for injection The body absorbs insulin differently, depending on where the insulin is injected (injection site). It is best to inject insulin into the same body area each time (for example, always in the abdomen), but you should use a different spot in that area for each injection. Do not inject the insulin in the same spot each time. There are five main areas that can be used for injecting. These areas are:  Abdomen. This is the preferred area.  Front of thigh.  Upper, outer side of thigh.  Upper, outer side of arm.  Upper, outer part of buttock.   How to use an insulin pen Get ready 1. Wash your hands with soap and water. If soap and water are not available, use hand sanitizer. 2. Test your blood sugar (glucose) level and write down that number. Follow any instructions from your health care provider about what to do if your blood glucose level is higher or lower than your normal range. 3. Check the expiration date and the type of insulin that is in the pen. 4. If you are using CLEAR insulin, check to see that it is clear and free of clumps. 5. If you are using CLOUDY insulin, mix it by gently rolling the insulin pen between your palms several times. Do not shake the pen. 6. Remove the cap from the insulin pen. 7. Use an alcohol wipe to clean the rubber tip of the pen. 8. Remove the protective paper tab from the disposable needle. Do not let the needle touch anything. 9. Screw a new, unused needle onto the pen. 10. Remove the outer plastic needle  cover. Do not throw away the outer plastic cover yet. ? If the pen uses a special safety needle, leave the inner needle shield in place. ? If the pen does not use a special safety needle, remove the inner plastic cover from the needle. 11. Follow the manufacturer's instructions to prime the insulin pen with the volume of insulin needed. Hold the pen with the needle pointing up, and push the button on the opposite end of the pen until a drop of insulin appears at the needle tip. If no insulin appears, repeat this step. 12. Turn the button (dial) to the number of units of insulin that you will be injecting. Inject the insulin 1. Use an alcohol wipe to clean the site where you will be inserting the needle. Let the site air-dry. 2. Hold the pen in the palm of your writing hand like a pencil. 3. If directed by your health care provider, use your other hand to pinch and hold about 1 inch (2.5 cm) of skin at the injection site. Do not directly touch the cleaned part of the skin. 4. Gently but quickly, use your writing hand to put the needle straight into the skin. Insert the needle at a 45-degree angle or a 90-degree angle (perpendicular) to the skin, as directed by your health care provider. 5. When the needle is completely inserted into the skin, let go of the skin that you are  pinching. 6. Use your thumb or index finger of your writing hand to push the top button of the pen all the way to inject the insulin. Continue to hold the pen in place with your writing hand. 7. Wait 10 seconds, then pull the needle straight out of the skin. This will allow all of the insulin to go from the pen and needle into your body. 8. Carefully put the larger (outer) plastic cover of the needle back over the needle, then unscrew the capped needle and discard it in a sharps container, such as an empty plastic bottle with a cover. 9. Put the plastic cap back on the insulin pen.   How to throw away supplies  Discard all used  needles in a sharps container.  Follow the disposal regulations for the area where you live. Do not use any needle more than one time.  Throw away empty disposable pens in the regular trash. Questions to ask your health care provider  How often should I be taking insulin?  How often should I check my blood glucose?  What amount of insulin should I be taking at each time?  What are the side effects?  What should I do if my blood glucose is too high?  What should I do if my blood glucose is too low?  What should I do if I forget to take my insulin?  What number should I call if I have questions? Where to find more information  American Diabetes Association (ADA): www.diabetes.org  Association of Diabetes Care and Education Specialists (ADCES): www.diabeteseducator.org Summary  Before you give yourself an insulin injection, be sure to wash your hands and test your blood sugar level. Write down that number.  Check the expiration date and the type of insulin that is in the pen. The type of insulin that you take may determine how many injections you give yourself and when you need to give the injections.  It is best to inject insulin into the same body area each time (for example, always in the abdomen), but you should use a different spot in that area for each injection.  Do not use a needle more than one time. This information is not intended to replace advice given to you by your health care provider. Make sure you discuss any questions you have with your health care provider. Document Revised: 01/24/2019 Document Reviewed: 01/24/2019 Elsevier Patient Education  2021 [place]. Hemoglobin A1C Test Why am I having this test? You may have the hemoglobin A1C test (A1C test) done to:  Evaluate your risk for developing diabetes (diabetes mellitus).  Diagnose diabetes.  Monitor long-term control of blood sugar (glucose) in people who have diabetes and help make treatment  decisions. This test may be done with other blood glucose tests, such as fasting blood glucose and oral glucose tolerance tests. What is being tested? Hemoglobin is a type of protein in the blood that carries oxygen. Glucose attaches to hemoglobin to form glycated hemoglobin. This test checks the amount of glycated hemoglobin in your blood, which is a good indicator of the average amount of glucose in your blood during the past 2-3 months. What kind of sample is taken? A blood sample is required for this test. It is usually collected by inserting a needle into a blood vessel.   Tell a health care provider about:  All medicines you are taking, including vitamins, herbs, eye drops, creams, and over-the-counter medicines.  Any blood disorders you have.  Any  surgeries you have had.  Any medical conditions you have.  Whether you are pregnant or may be pregnant. How are the results reported? Your results will be reported as a percentage that indicates how much of your hemoglobin has glucose attached to it (is glycated). Your health care provider will compare your results to normal ranges that were established after testing a large group of people (reference ranges). Reference ranges may vary among labs and hospitals. For this test, common reference ranges are:  Adult or child without diabetes: 4-5.6%.  Adult or child with diabetes and good blood glucose control: less than 7%. What do the results mean? If you have diabetes:  A result of less than 7% is considered normal, meaning that your blood glucose is well controlled.  A result higher than 7% means that your blood glucose is not well controlled, and your treatment plan may need to be adjusted. If you do not have diabetes:  A result within the reference range is considered normal, meaning that you are not at high risk for diabetes.  A result of 5.7-6.4% means that you have a high risk of developing diabetes, and you have prediabetes.  Prediabetes is the condition of having a blood glucose level that is higher than it should be but not high enough for you to be diagnosed with diabetes. Having prediabetes puts you at risk for developing type 2 diabetes. You may have more tests, including a repeat A1C test.  Results of 6.5% or higher on two separate A1C tests mean that you have diabetes. You may have more tests to confirm the diagnosis. Abnormally low A1C values may be caused by:  Pregnancy.  Severe blood loss.  Receiving donated blood (transfusions).  Low red blood cell count (anemia).  Long-term kidney failure.  Some unusual forms (variants) of hemoglobin. Talk with your health care provider about what your results mean. Questions to ask your health care provider Ask your health care provider, or the department that is doing the test:  When will my results be ready?  How will I get my results?  What are my treatment options?  What other tests do I need?  What are my next steps? Summary  The A1C test may be done to evaluate your risk for developing diabetes, to diagnose diabetes, and to monitor long-term control of blood sugar (glucose) in people who have diabetes and help make treatment decisions.  Hemoglobin is a type of protein in the blood that carries oxygen. Glucose attaches to hemoglobin to form glycated hemoglobin. This test checks the amount of glycated hemoglobin in your blood, which is a good indicator of the average amount of glucose in your blood during the past 2-3 months.  Talk with your health care provider about what your results mean. This information is not intended to replace advice given to you by your health care provider. Make sure you discuss any questions you have with your health care provider. Document Revised: 09/18/2019 Document Reviewed: 09/18/2019 Elsevier Patient Education  2021 [place]. Hyperglycemia Hyperglycemia occurs when the level of sugar (glucose) in the blood is  too high. Glucose is a type of sugar that provides the body's main source of energy. Certain hormones (insulin and glucagon) control the level of glucose in the blood. Insulin lowers blood glucose, and glucagon increases blood glucose. Hyperglycemia can result from not having enough insulin in the bloodstream, or from the body not responding normally to insulin. Hyperglycemia occurs most often in people who have diabetes (  diabetes mellitus), but it can happen in people who do not have diabetes. It can develop quickly, and it can be life-threatening if it causes you to become severely dehydrated (diabetic ketoacidosis or hyperglycemic hyperosmolar state). Severe hyperglycemia is a medical emergency. For most people with diabetes, a blood glucose level above 240 mg/dL is considered hyperglycemia. What are the causes? If you have diabetes, hyperglycemia may be caused by:  Medicines that increase blood glucose or affect your diabetes control.  Getting less physical activity.  Eating more than planned.  Being sick or injured, having an infection, or having surgery.  Stress.  Not giving yourself enough insulin (if you are taking insulin). If you have undiagnosed diabetes, this may be the reason you have hyperglycemia. If you do not have diabetes, hyperglycemia may be caused by:  Certain medicines, including: ? Steroid medicines. ? Beta-blockers. ? Epinephrine. ? Thiazide diuretics.  Stress.  Having a serious illness, an infection, or surgery.  Diseases of the pancreas. What increases the risk? Hyperglycemia is more likely to develop in people who have risk factors for diabetes, such as:  Having a family member with diabetes.  Certain conditions in which the body's disease-fighting system (immune system) attacks itself (autoimmune disorders).  Being overweight or obese.  Having an inactive (sedentary) lifestyle.  Having been diagnosed with insulin resistance.  Having a history of  prediabetes, gestational diabetes, or polycystic ovarian syndrome (PCOS). What are the signs or symptoms? Hyperglycemia may not cause any symptoms. If you do have symptoms, they may include:  Increased thirst.  Needing to urinate more often than usual.  Hunger.  Feeling very tired.  Blurry vision. Other symptoms may develop if hyperglycemia gets worse, such as:  Dry mouth.  Abdominal pain.  Loss of appetite.  Fruity-smelling breath.  Weakness.  Unexpected weight loss.  Tingling or numbness in the hands or feet.  Headache.  Cuts or bruises that are slow to heal. How is this diagnosed? Hyperglycemia is diagnosed with a blood test to measure your blood glucose level. This blood test is usually done while you are having symptoms. Your health care provider may also do a physical exam and review your medical history. You may have more tests to determine the cause of your hyperglycemia, such as:  A fasting blood glucose (FBG) test. You will not be allowed to eat (you will fast) for at least 8 hours before a blood sample is taken.  An A1C blood test. This provides information about blood glucose control over the previous 2-3 months.  An oral glucose tolerance test (OGTT). This measures your blood glucose at two times: ? After fasting. This is your baseline blood glucose level. ? 2 hours after drinking a beverage that contains glucose. How is this treated? Treatment depends on the cause of your hyperglycemia. Treatment may include:  Taking medicine to regulate your blood glucose levels. If you take insulin or other diabetes medicines, your medicine or dosage may be adjusted.  Lifestyle changes, such as exercising more, eating healthier foods, or losing weight.  Treating an illness or infection.  Checking your blood glucose more often.  Stopping or reducing steroid medicines. If your hyperglycemia becomes severe and it results in diabetic ketoacidosis or hyperglycemic  hyperosmolar state, you must be hospitalized and given IV fluids and IV insulin. Follow these instructions at home: General instructions  Take over-the-counter and prescription medicines only as told by your health care provider.  Do not use any products that contain nicotine or tobacco. These  products include cigarettes, chewing tobacco, and vaping devices, such as e-cigarettes. If you need help quitting, ask your health care provider.  If you drink alcohol: ? Limit how much you have to:  0-1 drink a day for women who are not pregnant.  0-2 drinks a day for men. ? Know how much alcohol is in a drink. In the [place] one drink equals one 12 oz bottle of beer (355 mL), one 5 oz glass of wine (148 mL), or one 1 oz glass of hard liquor (44 mL).  Learn to manage stress. If you need help with this, ask your health care provider.  Do exercises as told by your health care provider.  Keep all follow-up visits. This is important. Eating and drinking  Maintain a healthy weight.  Stay hydrated, especially when you exercise, get sick, or spend time in hot temperatures.  Drink enough fluid to keep your urine pale yellow.   If you have diabetes:  Know the symptoms of hyperglycemia.  Follow your diabetes management plan as told by your health care provider. Make sure you: ? Take your insulin and medicines as told. ? Follow your exercise plan. ? Follow your meal plan. Eat on time, and do not skip meals. ? Check your blood glucose as often as told. Make sure to check your blood glucose before and after exercise. If you exercise longer or in a different way, check your blood glucose more often. ? Follow your sick day plan whenever you cannot eat or drink normally. Make this plan in advance with your health care provider.  Share your diabetes management plan with people in your workplace, school, and household.  Check your urine for ketones when you are ill and as told by your health care  provider.  Carry a medical alert card or wear medical alert jewelry.   Where to find more information American Diabetes Association: www.diabetes.org Contact a health care provider if:  Your blood glucose is at or above 240 mg/dL (13.3 mmol/L) for 2 days in a row.  You have problems keeping your blood glucose in your target range.  You have frequent episodes of hyperglycemia.  You have signs of illness, such as nausea, vomiting, or fever. Get help right away if:  Your blood glucose monitor reads "high" even when you are taking insulin.  You have trouble breathing.  You have a change in how you think, feel, or act (mental status).  You have nausea or vomiting that does not go away. These symptoms may represent a serious problem that is an emergency. Do not wait to see if the symptoms will go away. Get medical help right away. Call your local emergency services (911 in the [place] Do not drive yourself to the hospital. Summary  Hyperglycemia occurs when the level of sugar (glucose) in the blood is too high.  Hyperglycemia can happen with or without diabetes, and severe hyperglycemia can be life-threatening.  Hyperglycemia is diagnosed with a blood test to measure your blood glucose level. This blood test is usually done while you are having symptoms. Your health care provider may also do a physical exam and review your medical history.  If you have diabetes, follow your diabetes management plan as told by your health care provider.  Contact your health care provider if you have problems keeping your blood glucose in your target range. This information is not intended to replace advice given to you by your health care provider. Make sure you discuss any questions  you have with your health care provider. Document Revised: 10/04/2019 Document Reviewed: 10/04/2019 Elsevier Patient Education  2021 [place]. Hypoglycemia Hypoglycemia is when the sugar (glucose) level in your blood  is too low. Low blood sugar can happen to people who have diabetes and people who do not have diabetes. Low blood sugar can happen quickly, and it can be an emergency. What are the causes? This condition happens most often in people who have diabetes and may be caused by:  Diabetes medicine.  Not eating enough, or not eating often enough.  Doing more physical activity.  Drinking alcohol on an empty stomach. If you do not have diabetes, hypoglycemia may be caused by:  A tumor in the pancreas.  Not eating enough, or not eating for long periods at a time (fasting).  A very bad infection or illness.  Problems after having weight loss (bariatric) surgery.  Kidney failure or liver failure.  Certain medicines. What increases the risk? This condition is more likely to develop in people who:  Have diabetes and take medicines to lower their blood sugar.  Abuse alcohol.  Have a very bad illness. What are the signs or symptoms? Symptoms depend on whether your low blood sugar is mild, moderate, or very low. Mild  Hunger.  Feeling worried or nervous (anxious).  Sweating and feeling clammy.  Feeling dizzy or light-headed.  Being sleepy or having trouble sleeping.  Feeling like you may vomit (nauseous).  A fast heartbeat.  A headache.  Blurry vision.  Being irritable or grouchy.  Tingling or loss of feeling (numbness) around your mouth, lips, or tongue.  Trouble with moving (coordination). Moderate  Confusion and poor judgment.  Behavior changes.  Weakness.  Uneven heartbeats. Very low Very low blood sugar (severe hypoglycemia) is a medical emergency. It can cause:  Fainting.  Jerky movements that you cannot control (seizure).  Loss of consciousness (coma).  Death. How is this treated? Treating low blood sugar Low blood sugar is often treated by eating or drinking something sugary right away. The snack should contain 15 grams of a fast-acting carb  (carbohydrate). Options include:  4 oz (120 mL) of fruit juice.  4-6 oz (120-150 mL) of regular soda (not diet soda).  8 oz (240 mL) of low-fat milk.  Several pieces of hard candy. Check food labels to find out how many to eat for 15 grams.  1 Tbsp (15 mL) of sugar or honey. Treating low blood sugar if you have diabetes If you can think clearly and swallow safely, follow the 15:15 rule:  Take 15 grams of a fast-acting carb. Talk with your doctor about how much you should take.  Always keep a source of fast-acting carb with you, such as: ? Sugar tablets (glucose pills). Take 4 pills. ? Several pieces of hard candy. Check food labels to see how many pieces to eat for 15 grams. ? 4 oz (120 mL) of fruit juice. ? 4-6 oz (120-150 mL) of regular (not diet) soda. ? 1 Tbsp (15 mL) of honey or sugar.  Check your blood sugar 15 minutes after you take the carb.  If your blood sugar is still at or below 70 mg/dL (3.9 mmol/L), take 15 grams of a carb again.  If your blood sugar does not go above 70 mg/dL (3.9 mmol/L) after 3 tries, get help right away.  After your blood sugar goes back to normal, eat a meal or a snack within 1 hour.   Treating very low blood  sugar If your blood sugar is at or below 54 mg/dL (3 mmol/L), you have very low blood sugar, or severe hypoglycemia. This is an emergency. Get medical help right away. If you have very low blood sugar and you cannot eat or drink, you will need to be given a hormone called glucagon. A family member or friend should learn how to check your blood sugar and how to give you glucagon. Ask your doctor if you need to have an emergency glucagon kit at home. Very low blood sugar may also need to be treated in a hospital. Follow these instructions at home: General instructions  Take over-the-counter and prescription medicines only as told by your doctor.  Stay aware of your blood sugar as told by your doctor.  If you drink alcohol: ? Limit how  much you use to:  0-1 drink a day for nonpregnant women.  0-2 drinks a day for men. ? Be aware of how much alcohol is in your drink. In the [place] one drink equals one 12 oz bottle of beer (355 mL), one 5 oz glass of wine (148 mL), or one 1 oz glass of hard liquor (44 mL).  Keep all follow-up visits as told by your doctor. This is important. If you have diabetes:  Always have a rapid-acting carb (15 grams) option with you to treat low blood sugar.  Follow your diabetes care plan as told by your doctor. Make sure you: ? Know the symptoms of low blood sugar. ? Check your blood sugar as often as told by your doctor. Always check it before and after exercise. ? Always check your blood sugar before you drive. ? Take your medicines as told. ? Follow your meal plan. ? Eat on time. Do not skip meals.  Share your diabetes care plan with: ? Your work or school. ? People you live with.  Carry a card or wear jewelry that says you have diabetes.   Contact a doctor if:  You have trouble keeping your blood sugar in your target range.  You have low blood sugar often. Get help right away if:  You still have symptoms after you eat or drink something that contains 15 grams of fast-acting carb and you cannot get your blood sugar above 70 mg/dL by following the 15:15 rule.  Your blood sugar is at or below 54 mg/dL (3 mmol/L).  You have a seizure.  You faint. These symptoms may be an emergency. Do not wait to see if the symptoms will go away. Get medical help right away. Call your local emergency services (911 in the [place] Do not drive yourself to the hospital. Summary  Hypoglycemia happens when the level of sugar (glucose) in your blood is too low.  Low blood sugar can happen to people who have diabetes and people who do not have diabetes. Low blood sugar can happen quickly, and it can be an emergency.  Make sure you know the symptoms of low blood sugar and know how to treat it.  Always  keep a source of sugar (fast-acting carb) with you to treat low blood sugar. This information is not intended to replace advice given to you by your health care provider. Make sure you discuss any questions you have with your health care provider. Document Revised: 11/14/2018 Document Reviewed: 11/14/2018 Elsevier Patient Education  2021 [place] on my medicine - ELIQUIS (apixaban)  This medication education was reviewed with me or my healthcare representative as part of my discharge  preparation.    Why was Eliquis prescribed for you? 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  What do You need to know about Eliquis ? The starting dose is 10 mg (two 5 mg tablets) taken TWICE daily for the FIRST SEVEN (7) DAYS, then on   01/20/2020 evening dose  the dose is reduced to ONE 5 mg tablet taken TWICE daily.  Eliquis may be taken with or without food.   Try to take the dose about the same time in the morning and in the evening. If you have difficulty swallowing the tablet whole please discuss with your pharmacist how to take the medication safely.  Take Eliquis exactly as prescribed and DO NOT stop taking Eliquis without talking to the doctor who prescribed the medication.  Stopping may increase your risk of developing a new blood clot.  Refill your prescription before you run out.  After discharge, you should have regular check-up appointments with your healthcare provider that is prescribing your Eliquis.    What do you do if you miss a dose? If a dose of ELIQUIS is not taken at the scheduled time, take it as soon as possible on the same day and twice-daily administration should be resumed. The dose should not be doubled to make up for a missed dose.  Important Safety Information A possible side effect of  Eliquis is bleeding. You should call your healthcare provider right away if you experience any of the following: ? Bleeding from an injury or your nose that does not stop. ? Unusual colored urine (red or dark brown) or unusual colored stools (red or black). ? Unusual bruising for unknown reasons. ? A serious fall or if you hit your head (even if there is no bleeding).  Some medicines may interact with Eliquis and might increase your risk of bleeding or clotting while on Eliquis. To help avoid this, consult your healthcare provider or pharmacist prior to using any new prescription or non-prescription medications, including herbals, vitamins, non-steroidal anti-inflammatory drugs (NSAIDs) and supplements.  This website has more information on Eliquis (apixaban): http://[email or web address].[email or web address]/home

## 2020-01-14 NOTE — Discharge Summary (Signed)
Physician Discharge Summary  Tyler Morris QQV:[id number] DOB: 03/28/1990 DOA: 01/12/2020  PCP: Milford Cage, PA  Admit date: 01/12/2020 Discharge date: 01/14/2020  Admitted From: Home Disposition: Home  Recommendations for Outpatient Follow-up:  1. Follow up with PCP in 1-2 weeks 2. Please obtain BMP/CBC in one week  Home Health: Equipment/Devices:  Discharge Condition: Stable CODE STATUS: Full code Diet recommendation: Heart healthy, carb modified  Brief/Interim Summary: 30 year old male with a history of diabetes, admitted to the hospital with COVID-19 pneumonia and pulmonary emboli. He was noted to be hypoxic on ambulation. Started on anticoagulation. There was also concern for superimposed bacterial infection in addition to Covid. He has been on remdesivir and steroids, and intravenous antibiotics  Discharge Diagnoses:  Principal Problem:   Pneumonia due to COVID-19 virus Active Problems:   DKA (diabetic ketoacidosis) ([place])   Seizures ([place])   Type 2 diabetes mellitus without complication ([place])   Muliple Small Pulmonary embolism ([facility])   COVID-19  Acute respiratory failure with hypoxia -Secondary to COVID-19 pneumonia/PE -Oxygen saturations dropping into the 80s on room air -Patient was monitored in the hospital and was able to wean off of oxygen. -Currently he is breathing comfortably on room air on ambulation  COVID-19 pneumonia -Also concerns for superimposed bacterial component -He was started on intravenous antibiotics with ceftriaxone and azithromycin -He he also received remdesivir and steroids -Continue pulmonary hygiene -He was continued on a course of oral antibiotics -Discontinued steroids and remdesivir since he did not have any significant hypoxia at the time of discharge  Multiple small pulmonary emboli -Precipitated by COVID-19 -Started on intravenous heparin, transition to Eliquis -Echocardiogram unremarkable -Venous Dopplers negative for  DVT  Diabetic ketoacidosis -Started on insulin infusion -Overall anion gap is closed -Transitioned to subcutaneous insulin -A1c noted to be 9.7 -Resume oral agents including metformin and Januvia on discharge -Insulin glargine was also added to his diabetic regimen -Further adjustment of medications to be done as an outpatient  History of seizures -Diagnosed in the setting of hypoglycemia, not on any antiepileptic medications  Hypertension -Holding lisinopril and hydrochlorothiazide -Resume on discharge now that blood pressures have improved  Discharge Instructions  Discharge Instructions    Diet - low sodium heart healthy   Complete by: As directed    Increase activity slowly   Complete by: As directed      Allergies as of 01/14/2020   No Known Allergies     Medication List    STOP taking these medications   glipiZIDE 10 MG tablet Commonly known as: GLUCOTROL     TAKE these medications   albuterol 108 (90 Base) MCG/ACT inhaler Commonly known as: VENTOLIN HFA Inhale 2 puffs into the lungs every 6 (six) hours as needed for wheezing or shortness of breath.   amoxicillin-clavulanate 875-125 MG tablet Commonly known as: Augmentin Take 1 tablet by mouth 2 (two) times daily for 3 days.   apixaban 5 MG Tabs tablet Commonly known as: ELIQUIS Take 59m po bid for 7 days then 549mpo bid   azithromycin 500 MG tablet Commonly known as: Zithromax Take 1 tablet (500 mg total) by mouth daily for 3 days. Take 1 tablet daily for 3 days.   Basaglar KwikPen 100 UNIT/ML Inject 20 Units into the skin 2 (two) times daily.   blood glucose meter kit and supplies Dispense based on patient and insurance preference. Use up to four times daily as directed. (FOR ICD-9 250.00, 250.01).   CINNAMON PO Take by mouth.   hydrochlorothiazide  12.5 MG capsule Commonly known as: MICROZIDE Take 1 capsule (12.5 mg total) by mouth daily.   lisinopril 5 MG tablet Commonly known as:  ZESTRIL Take 1 tablet (5 mg total) by mouth daily.   metFORMIN 1000 MG tablet Commonly known as: GLUCOPHAGE Take 1 tablet (1,000 mg total) by mouth 2 (two) times daily with a meal.   Pen Needles 30G X 8 MM Misc Use as directed   sitaGLIPtin 50 MG tablet Commonly known as: Januvia Take 1 tablet (50 mg total) by mouth daily.       Follow-up Information    Milford Cage, PA. Schedule an appointment as soon as possible for a visit in 2 week(s).   Specialty: Physician Assistant Why: you will need to quarantine until 01/26/20 Contact information: [place] [address] [phone]              No Known Allergies  Consultations:     Procedures/Studies: CT Angio Chest PE W and/or Wo Contrast  Result Date: 01/12/2020 CLINICAL DATA:  Coronavirus infection. Pulmonary embolism suspected. EXAM: CT ANGIOGRAPHY CHEST WITH CONTRAST TECHNIQUE: Multidetector CT imaging of the chest was performed using the standard protocol during bolus administration of intravenous contrast. Multiplanar CT image reconstructions and MIPs were obtained to evaluate the vascular anatomy. CONTRAST:  39m OMNIPAQUE IOHEXOL 350 MG/ML SOLN COMPARISON:  Radiography same day. FINDINGS: Cardiovascular: Heart size is normal. No pericardial effusion. No aortic pathology. Pulmonary arterial opacification is good. There is some breathing motion. There are numerous small bilateral pulmonary emboli. Mediastinum/Nodes: Mild reactive nodal prominence in the hila and mediastinal region. Lungs/Pleura: The patient has widespread focal pulmonary infiltrates throughout both lungs. Pattern is different than usually seen with coronavirus pneumonia, in that many of the infiltrates are discrete and rounded. Whereas this could be an atypical manifestation of coronavirus pneumonia, other atypical pneumonias should be considered, particularly in the setting adenopathy which does not usually occur with coronavirus  infection. Upper Abdomen: Negative Musculoskeletal: Negative Review of the MIP images confirms the above findings. IMPRESSION: 1. Numerous small bilateral pulmonary emboli. 2. Widespread bilateral pulmonary infiltrates. The pattern is different than usually seen with coronavirus pneumonia, in that many of the infiltrates are discrete and rounded. Whereas this could be an atypical manifestation of coronavirus pneumonia, other atypical pneumonias should be considered, particularly in the setting of adenopathy which does not usually occur with coronavirus infection. Electronically Signed   By: MNelson ChimesM.D.   On: 01/12/2020 14:33   UKoreaVenous Img Lower Bilateral (DVT)  Result Date: 01/13/2020 CLINICAL DATA:  Shortness of breath, pulmonary emboli EXAM: BILATERAL LOWER EXTREMITY VENOUS DOPPLER ULTRASOUND TECHNIQUE: Gray-scale sonography with graded compression, as well as color Doppler and duplex ultrasound were performed to evaluate the lower extremity deep venous systems from the level of the common femoral vein and including the common femoral, femoral, profunda femoral, popliteal and calf veins including the posterior tibial, peroneal and gastrocnemius veins when visible. The superficial great saphenous vein was also interrogated. Spectral Doppler was utilized to evaluate flow at rest and with distal augmentation maneuvers in the common femoral, femoral and popliteal veins. COMPARISON:  None. FINDINGS: Technical note: Examination is slightly limited secondary to poor penetration related to body habitus. RIGHT LOWER EXTREMITY Common Femoral Vein: No evidence of thrombus. Normal compressibility, respiratory phasicity and response to augmentation. Saphenofemoral Junction: No evidence of thrombus. Normal compressibility and flow on color Doppler imaging. Profunda Femoral Vein: No evidence of thrombus. Normal compressibility and flow on color  Doppler imaging. Femoral Vein: No evidence of thrombus. Normal  compressibility, respiratory phasicity and response to augmentation. Popliteal Vein: No evidence of thrombus. Normal compressibility, respiratory phasicity and response to augmentation. Calf Veins: No evidence of thrombus. Normal compressibility and flow on color Doppler imaging. Superficial Great Saphenous Vein: No evidence of thrombus. Normal compressibility. Venous Reflux:  None. Other Findings:  None. LEFT LOWER EXTREMITY Common Femoral Vein: No evidence of thrombus. Normal compressibility, respiratory phasicity and response to augmentation. Saphenofemoral Junction: No evidence of thrombus. Normal compressibility and flow on color Doppler imaging. Profunda Femoral Vein: No evidence of thrombus. Normal compressibility and flow on color Doppler imaging. Femoral Vein: No evidence of thrombus. Normal compressibility, respiratory phasicity and response to augmentation. Popliteal Vein: No evidence of thrombus. Normal compressibility, respiratory phasicity and response to augmentation. Calf Veins: No evidence of thrombus. Normal compressibility and flow on color Doppler imaging. Superficial Great Saphenous Vein: No evidence of thrombus. Normal compressibility. Venous Reflux:  None. Other Findings:  None. IMPRESSION: No evidence of deep venous thrombosis in either lower extremity. Electronically Signed   By: Davina Poke D.O.   On: 01/13/2020 09:18   DG Chest Port 1 View  Result Date: 01/12/2020 CLINICAL DATA:  Chest pain, cough and shortness of breath, COVID positive EXAM: PORTABLE CHEST 1 VIEW COMPARISON:  None. FINDINGS: Diffuse bilateral patchy mixed interstitial and airspace opacities predominantly in the mid and lower lungs compatible with pneumonia related to COVID. Overall low lung volumes. Normal heart size. No large effusion or pneumothorax. Trachea midline. No acute osseous finding. IMPRESSION: Bilateral mid and lower lung pneumonia pattern related to COVID. Electronically Signed   By: Jerilynn Mages.  Shick M.D.    On: 01/12/2020 12:37   ECHOCARDIOGRAM COMPLETE  Result Date: 01/13/2020    ECHOCARDIOGRAM REPORT   Patient Name:   Tyler Morris Date of Exam: 01/13/2020 Medical Rec #:  [id number]    Height:       69.0 in Accession #:    [id number]   Weight:       300.0 lb Date of Birth:  [birth year]    BSA:          2.455 m Patient Age:    29 years     BP:           133/87 mmHg Patient Gender: M            HR:           93 bpm. Exam Location:  [facility] Procedure: 2D Echo, Cardiac Doppler and Color Doppler Indications:    Pulmonary Embolus 415.19 / I26.99  History:        Patient has no prior history of Echocardiogram examinations.                 Risk Factors:Diabetes. Pneumonia due to COVID-19 virus, Morbid                 Obesity.  Sonographer:    Alvino Chapel RCS Referring Phys: [phone] [place]  1. Left ventricular ejection fraction, by estimation, is 70 to 75%. The left ventricle has hyperdynamic function. The left ventricle has no regional wall motion abnormalities. Left ventricular diastolic parameters were normal.  2. Right ventricular systolic function is normal. The right ventricular size is normal.  3. The mitral valve is normal in structure. Trivial mitral valve regurgitation. No evidence of mitral stenosis.  4. The aortic valve is tricuspid. Aortic valve regurgitation is not visualized. No aortic stenosis is present.  5. The inferior vena cava is normal in size with <50% respiratory variability, suggesting right atrial pressure of 8 mmHg. FINDINGS  Left Ventricle: Left ventricular ejection fraction, by estimation, is 70 to 75%. The left ventricle has hyperdynamic function. The left ventricle has no regional wall motion abnormalities. The left ventricular internal cavity size was normal in size. There is no left ventricular hypertrophy. Left ventricular diastolic parameters were normal. Right Ventricle: The right ventricular size is normal. No increase in right ventricular wall thickness. Right  ventricular systolic function is normal. Left Atrium: Left atrial size was normal in size. Right Atrium: Right atrial size was normal in size. Pericardium: There is no evidence of pericardial effusion. Mitral Valve: The mitral valve is normal in structure. Trivial mitral valve regurgitation. No evidence of mitral valve stenosis. Tricuspid Valve: The tricuspid valve is normal in structure. Tricuspid valve regurgitation is not demonstrated. No evidence of tricuspid stenosis. Aortic Valve: The aortic valve is tricuspid. Aortic valve regurgitation is not visualized. No aortic stenosis is present. Aortic valve mean gradient measures 4.2 mmHg. Aortic valve peak gradient measures 7.1 mmHg. Aortic valve area, by VTI measures 4.09 cm. Pulmonic Valve: The pulmonic valve was not well visualized. Pulmonic valve regurgitation is not visualized. No evidence of pulmonic stenosis. Aorta: The aortic root is normal in size and structure. Pulmonary Artery: Indeterminant PASP, inadequate TR jet. Venous: The inferior vena cava is normal in size with less than 50% respiratory variability, suggesting right atrial pressure of 8 mmHg. IAS/Shunts: No atrial level shunt detected by color flow Doppler.  LEFT VENTRICLE PLAX 2D LVIDd:         4.70 cm  Diastology LVIDs:         2.00 cm  LV e' medial:    10.90 cm/s LV PW:         1.10 cm  LV E/e' medial:  10.6 LV IVS:        1.00 cm  LV e' lateral:   13.10 cm/s LVOT diam:     2.30 cm  LV E/e' lateral: 8.9 LV SV:         106 LV SV Index:   43 LVOT Area:     4.15 cm  RIGHT VENTRICLE RV S prime:     16.00 cm/s TAPSE (M-mode): 2.9 cm LEFT ATRIUM             Index       RIGHT ATRIUM           Index LA diam:        3.30 cm 1.34 cm/m  RA Area:     15.50 cm LA Vol (A2C):   51.0 ml 20.78 ml/m RA Volume:   42.40 ml  17.27 ml/m LA Vol (A4C):   63.4 ml 25.83 ml/m LA Biplane Vol: 62.6 ml 25.50 ml/m  AORTIC VALVE AV Area (Vmax):    3.21 cm AV Area (Vmean):   3.35 cm AV Area (VTI):     4.09 cm AV  Vmax:           133.33 cm/s AV Vmean:          98.378 cm/s AV VTI:            0.260 m AV Peak Grad:      7.1 mmHg AV Mean Grad:      4.2 mmHg LVOT Vmax:         103.00 cm/s LVOT Vmean:        79.400 cm/s LVOT  VTI:          0.256 m LVOT/AV VTI ratio: 0.99  AORTA Ao Root diam: 3.40 cm MITRAL VALVE MV Area (PHT): 3.37 cm     SHUNTS MV Decel Time: 225 msec     Systemic VTI:  0.26 m MV E velocity: 116.00 cm/s  Systemic Diam: 2.30 cm MV A velocity: 76.60 cm/s MV E/A ratio:  1.51 Carlyle Dolly MD Electronically signed by Carlyle Dolly MD Signature Date/Time: 01/13/2020/11:15:54 AM    Final        Subjective: Patient is feeling better today.  Denies any shortness of breath.  No cough.  Wants to go home.  He is on room air and ambulating without difficulty.  Discharge Exam: Vitals:   01/14/20 0212 01/14/20 0329 01/14/20 0658 01/14/20 0738  BP: (!) 136/91  (!) 143/94   Pulse: 94  89   Resp:   16   Temp: 97.6 F (36.4 C)  98.1 F (36.7 C)   TempSrc: Oral  Oral   SpO2: 96% 94% 95% 96%  Weight:      Height:        General: Pt is alert, awake, not in acute distress Cardiovascular: RRR, S1/S2 +, no rubs, no gallops Respiratory: CTA bilaterally, no wheezing, no rhonchi Abdominal: Soft, NT, ND, bowel sounds + Extremities: no edema, no cyanosis    The results of significant diagnostics from this hospitalization (including imaging, microbiology, ancillary and laboratory) are listed below for reference.     Microbiology: Recent Results (from the past 240 hour(s))  Novel Coronavirus, NAA ([facility])     Status: Abnormal   Collection Time: 01/05/20  4:38 PM   Specimen: Nasopharyngeal(NP) swabs in vial transport medium   Nasopharynge  Patient  Result Value Ref Range Status   SARS-CoV-2, NAA Detected (A) Not Detected Final    Comment: Patients who have a positive COVID-19 test result may now have treatment options. Treatment options are available for patients with mild to moderate symptoms and  for hospitalized patients. Visit our website at [email or web address] for resources and information. This nucleic acid amplification test was developed and its performance characteristics determined by [employer]. Nucleic acid amplification tests include RT-PCR and TMA. This test has not been FDA cleared or approved. This test has been authorized by FDA under an Emergency Use Authorization (EUA). This test is only authorized for the duration of time the declaration that circumstances exist justifying the authorization of the emergency use of in vitro diagnostic tests for detection of SARS-CoV-2 virus and/or diagnosis of COVID-19 infection under section 564(b)(1) of the Act, 21 U.S.C. 161WRU-0(A) (1), unless the authorization is terminated or revoked sooner. When diagnostic testing is negativ e, the possibility of a false negative result should be considered in the context of a patient's recent exposures and the presence of clinical signs and symptoms consistent with COVID-19. An individual without symptoms of COVID-19 and who is not shedding SARS-CoV-2 virus would expect to have a negative (not detected) result in this assay.   SARS-COV-2, NAA 2 DAY TAT     Status: None   Collection Time: 01/05/20  4:38 PM   Nasopharynge  Patient  Result Value Ref Range Status   SARS-CoV-2, NAA 2 DAY TAT Performed  Final  Blood Culture (routine x 2)     Status: None (Preliminary result)   Collection Time: 01/12/20 12:17 PM   Specimen: BLOOD  Result Value Ref Range Status   Specimen Description   Final    BLOOD  RIGHT ANTECUBITAL Performed at [facility], [place] [address], [place], [place] [address]    Special Requests   Final    BOTTLES DRAWN AEROBIC AND ANAEROBIC Blood Culture adequate volume Performed at [facility], [place] [address], [place], [place] [address]    Culture   Final    NO GROWTH 2 DAYS Performed at [facility], [address] [place], [place] [address]    Report Status PENDING  Incomplete  Blood Culture (routine x 2)     Status: None (Preliminary result)   Collection Time: 01/12/20 12:55 PM   Specimen: BLOOD  Result Value Ref Range Status   Specimen Description   Final    BLOOD LEFT ANTECUBITAL Performed at [facility], [place] [address], [place], [place] [address]    Special Requests   Final    BOTTLES DRAWN AEROBIC AND ANAEROBIC Blood Culture adequate volume Performed at [facility], [place] [address], [place], [place] [address]    Culture   Final    NO GROWTH 2 DAYS Performed at [facility], [address], [place],  [address]    Report Status PENDING  Incomplete     Labs: BNP (last 3 results) No results for input(s): BNP in the last 8760 hours. Basic Metabolic Panel: Recent Labs  Lab 01/12/20 1217 01/12/20 2131 01/13/20 0148 01/13/20 0629 01/14/20 0609  NA 131* 134* 130* 137 137  K 4.5 4.0 3.8 3.7 4.1  CL 98 103 102 105 105  CO2 17* 15* 17* 22 22  GLUCOSE 363* 220* 231* 201* 326*  BUN _0 CREATININE 0.91 0.83 0.74 0.56* 0.81  CALCIUM 8.5* 8.3* 7.5* 8.5* 8.4*   Liver Function Tests: Recent Labs  Lab 01/12/20 1217 01/13/20 0629 01/14/20 0609  AST 37 39 25  ALT 44 46* 45*  ALKPHOS 70 56 56  BILITOT 1.5* 0.7 0.9  PROT 7.5 6.9 6.5  ALBUMIN 2.6* 2.2* 2.1*   No results for input(s): LIPASE, AMYLASE in the last 168 hours. No results for input(s): AMMONIA in the last 168 hours. CBC: Recent Labs  Lab 01/12/20 1217 01/13/20 0629 01/14/20 0609  WBC 18.2* 14.5* 13.9*  NEUTROABS 13.7*  --   --   HGB 11.2* 9.8* 9.6*  HCT 34.2* 30.0* 30.5*  MCV 89.1 88.8 89.7  PLT 268 288 346   Cardiac Enzymes: No results for input(s): CKTOTAL, CKMB, CKMBINDEX, TROPONINI in the last 168 hours. BNP: Invalid input(s): POCBNP CBG: Recent Labs  Lab 01/13/20 1550 01/13/20 1641 01/13/20 2137 01/14/20 0726  01/14/20 1114  GLUCAP 153* 169* 307* 324* 400*   D-Dimer Recent Labs    01/13/20 0629 01/14/20 0609  DDIMER 2.89* 2.53*   Hgb A1c Recent Labs    01/12/20 1510  HGBA1C 9.7*   Lipid Profile Recent Labs    01/12/20 1217  TRIG 107   Thyroid function studies No results for input(s): TSH, T4TOTAL, T3FREE, THYROIDAB in the last 72 hours.  Invalid input(s): FREET3 Anemia work up Recent Labs    01/13/20 0629 01/14/20 0609  FERRITIN 1,932* 1,916*   Urinalysis    Component Value Date/Time   COLORURINE COLORLESS (A) 04/24/2016 1754   APPEARANCEUR CLEAR 04/24/2016 1754   LABSPEC 1.025 05/03/2016 1449   PHURINE 5.5 05/03/2016 1449   GLUCOSEU 500 (A) 05/03/2016 1449   HGBUR NEGATIVE 05/03/2016 [place] 05/03/2016 [place] (A) 05/03/2016 1449  PROTEINUR 30 (A) 05/03/2016 1449   UROBILINOGEN 0.2 05/03/2016 1449   NITRITE NEGATIVE 05/03/2016 1449   LEUKOCYTESUR NEGATIVE 05/03/2016 1449   Sepsis Labs Invalid input(s): PROCALCITONIN,  WBC,  LACTICIDVEN Microbiology Recent Results (from the past 240 hour(s))  Novel Coronavirus, NAA ([facility])     Status: Abnormal   Collection Time: 01/05/20  4:38 PM   Specimen: Nasopharyngeal(NP) swabs in vial transport medium   Nasopharynge  Patient  Result Value Ref Range Status   SARS-CoV-2, NAA Detected (A) Not Detected Final    Comment: Patients who have a positive COVID-19 test result may now have treatment options. Treatment options are available for patients with mild to moderate symptoms and for hospitalized patients. Visit our website at [email or web address] for resources and information. This nucleic acid amplification test was developed and its performance characteristics determined by [employer]. Nucleic acid amplification tests include RT-PCR and TMA. This test has not been FDA cleared or approved. This test has been authorized by FDA under an Emergency Use  Authorization (EUA). This test is only authorized for the duration of time the declaration that circumstances exist justifying the authorization of the emergency use of in vitro diagnostic tests for detection of SARS-CoV-2 virus and/or diagnosis of COVID-19 infection under section 564(b)(1) of the Act, 21 U.S.C. 093OIZ-1(I) (1), unless the authorization is terminated or revoked sooner. When diagnostic testing is negativ e, the possibility of a false negative result should be considered in the context of a patient's recent exposures and the presence of clinical signs and symptoms consistent with COVID-19. An individual without symptoms of COVID-19 and who is not shedding SARS-CoV-2 virus would expect to have a negative (not detected) result in this assay.   SARS-COV-2, NAA 2 DAY TAT     Status: None   Collection Time: 01/05/20  4:38 PM   Nasopharynge  Patient  Result Value Ref Range Status   SARS-CoV-2, NAA 2 DAY TAT Performed  Final  Blood Culture (routine x 2)     Status: None (Preliminary result)   Collection Time: 01/12/20 12:17 PM   Specimen: BLOOD  Result Value Ref Range Status   Specimen Description   Final    BLOOD RIGHT ANTECUBITAL Performed at [facility], [place] [address] [place], [place] [address]    Special Requests   Final    BOTTLES DRAWN AEROBIC AND ANAEROBIC Blood Culture adequate volume Performed at [facility], [place] [address] [place], [place] [address]    Culture   Final    NO GROWTH 2 DAYS Performed at [facility], [address], [place], [place] [address]    Report Status PENDING  Incomplete  Blood Culture (routine x 2)     Status: None (Preliminary result)   Collection Time: 01/12/20 12:55 PM   Specimen: BLOOD  Result Value Ref Range Status   Specimen Description   Final    BLOOD LEFT ANTECUBITAL Performed at [facility], [place] [address] [place], [place] [address]    Special  Requests   Final    BOTTLES DRAWN AEROBIC AND ANAEROBIC Blood Culture adequate volume Performed at [facility], [place] [address], [place], [place] [address]    Culture   Final    NO GROWTH 2 DAYS Performed at [facility], [address], [place],  [address]    Report Status PENDING  Incomplete     Time coordinating discharge: 83mns  SIGNED:   JKathie Dike MD  [facility] 01/14/2020, 9:32 PM   If 7PM-7AM, please contact night-coverage [email or web address]

## 2020-01-14 NOTE — Progress Notes (Signed)
Pt educated on proper insulin inject, areas to inject and s/s of hyper- and hypoglycemia.

## 2020-01-14 NOTE — Progress Notes (Addendum)
Inpatient Diabetes Program Recommendations  AACE/ADA: New Consensus Statement on Inpatient Glycemic Control (2015)  Target Ranges:  Prepandial:   less than 140 mg/dL      Peak postprandial:   less than 180 mg/dL (1-2 hours)      Critically ill patients:  140 - 180 mg/dL   Lab Results  Component Value Date   GLUCAP 324 (H) 01/14/2020   HGBA1C 9.7 (H) 01/12/2020    Review of Glycemic Control Results for LAMEL, MCCARLEY (MRN [id number]) as of 01/14/2020 08:47  Ref. Range 01/13/2020 21:37 01/14/2020 07:26  Glucose-Capillary Latest Ref Range: 70 - 99 mg/dL 307 (H) 324 (H)    Admit with: Pneumonia due to Covid 19 virus with acute hypoxic respiratory failure/ Pulmonary Emboli/ DKA  History: Diabetes  Home DM Meds: Glipizide 10 mg BID                             Metformin 1000 mg BID                             Januvia 50 mg Daily  Current Orders: Lantus 20 units QD      Novolog 0-20 units TID & HS                            Decadron 6 mg Daily  Inpatient Diabetes Program Recommendations:    Consider increasing Lantus to 20 units BID, adding Tradjenta 5 mg QD and Novolog 5 units TID (assuming patient is consuming >50% of meals and steroids to continue).  Addendum: Spoke with patient regarding outpatient diabetes management. Patient checks CBGs at home and they are usually >200 mg/dL. Patient continues to say he wants to leave during conversation and states, "My blood sugars have never been this high."  Reviewed patient's current A1c of 9.7%. Explained what a A1c is and what it measures. Also reviewed goal A1c with patient, importance of good glucose control @ home, and blood sugar goals. Reviewed patho of DM, need for insulin, role of pancreas, impact of Covid to glucose trends, DKA, risk of reoccurring DKA without proper treatment, impact of steroids to current trends, vascular changes and commorbidities.  Stressed the importance of ensuring glucose management to prevent readmission.   Patient has glucose meter and supplies. Reviewed extensively when to call MD and target goals.  Educated patient on insulin pen use at home. Patient has never used insulin outpatient before. Reviewed contents of insulin flexpen starter kit. Reviewed all steps if insulin pen including attachment of needle, 2-unit air shot, dialing up dose, giving injection, removing needle, disposal of sharps, storage of unused insulin, disposal of insulin etc. RN to begin working with patient prior to leaving. Encouraged patient to learn about injections. Sending video links for patient to watch. Anticipate need for insulin at discharge, however, cannot see patient performing MDIs. Could consider Humalog 70/30 insulin pen BID #[id number].  Secure chat sent to MD to discuss.   Also, patient admits, "I have not done what I should be." Encouraged alternatives to sugary beverages, mindfulness of CHO intake and plate method. Patient expresses understanding.  Encouraged patient to make appointment with PCP for DM follow up.    Thanks, Bronson Curb, MSN, RNC-OB Diabetes Coordinator [phone] (8a-5p)

## 2020-01-17 LAB — CULTURE, BLOOD (ROUTINE X 2)
Culture: NO GROWTH
Culture: NO GROWTH
Special Requests: ADEQUATE
Special Requests: ADEQUATE

## 2020-02-28 ENCOUNTER — Other Ambulatory Visit: Payer: Self-pay | Admitting: Physician Assistant

## 2020-02-28 ENCOUNTER — Other Ambulatory Visit (HOSPITAL_COMMUNITY): Payer: Self-pay | Admitting: Physician Assistant

## 2020-03-10 ENCOUNTER — Other Ambulatory Visit: Payer: Self-pay | Admitting: Physician Assistant

## 2020-03-10 ENCOUNTER — Other Ambulatory Visit (HOSPITAL_COMMUNITY): Payer: Self-pay | Admitting: Physician Assistant

## 2020-03-10 DIAGNOSIS — I2699 Other pulmonary embolism without acute cor pulmonale: Secondary | ICD-10-CM

## 2020-03-18 ENCOUNTER — Ambulatory Visit (HOSPITAL_COMMUNITY): Payer: Managed Care, Other (non HMO)

## 2020-03-18 ENCOUNTER — Encounter (HOSPITAL_COMMUNITY): Payer: Self-pay

## 2020-03-18 ENCOUNTER — Other Ambulatory Visit: Payer: Self-pay

## 2020-03-18 ENCOUNTER — Ambulatory Visit (HOSPITAL_BASED_OUTPATIENT_CLINIC_OR_DEPARTMENT_OTHER)
Admission: RE | Admit: 2020-03-18 | Discharge: 2020-03-18 | Disposition: A | Discharge status: Home / Self Care | Payer: Managed Care, Other (non HMO) | Source: Ambulatory Visit | Attending: Physician Assistant | Admitting: Physician Assistant

## 2020-03-18 DIAGNOSIS — R161 Splenomegaly, not elsewhere classified: Secondary | ICD-10-CM | POA: Insufficient documentation

## 2020-03-18 DIAGNOSIS — I2699 Other pulmonary embolism without acute cor pulmonale: Secondary | ICD-10-CM | POA: Insufficient documentation

## 2020-03-18 DIAGNOSIS — J189 Pneumonia, unspecified organism: Secondary | ICD-10-CM | POA: Diagnosis not present

## 2020-03-18 DIAGNOSIS — Z8616 Personal history of COVID-19: Secondary | ICD-10-CM | POA: Insufficient documentation

## 2020-03-18 LAB — POCT I-STAT CREATININE: Creatinine, Ser: 0.9 mg/dL (ref 0.61–1.24)

## 2020-03-18 MED ORDER — IOHEXOL 350 MG/ML SOLN
100.0000 mL | Freq: Once | INTRAVENOUS | Status: AC | PRN
  Administered 2020-03-18: 100 mL via INTRAVENOUS
  Filled 2020-03-18: qty 100

## 2021-09-17 IMAGING — CT CT ANGIO CHEST
1 of 4 series · 14 of 36 positions shown · IV contrast (omnipaque)
Comparison: CT angiogram chest January 12, 2020

CLINICAL DATA: Shortness of breath. Reported recent [id number]
pneumonia

EXAM:
CT ANGIOGRAPHY CHEST WITH CONTRAST
TECHNIQUE: Multidetector CT imaging of the chest was performed using the
standard protocol during bolus administration of intravenous
contrast. Multiplanar CT image reconstructions and MIPs were
obtained to evaluate the vascular anatomy.
CONTRAST:  100mL OMNIPAQUE IOHEXOL 350 MG/ML SOLN

[Series 6: pe axial thins · axial · 0.81mm/px · z∈[+1230,+1476]mm · 14 of 284 slices shown]
[im 19/284  lung]
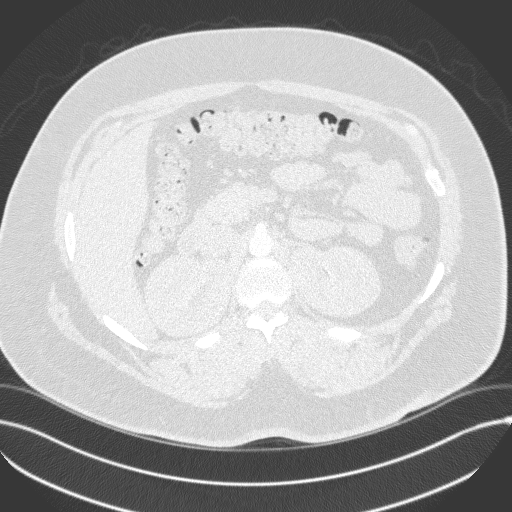
[im 38/284  soft-tissue]
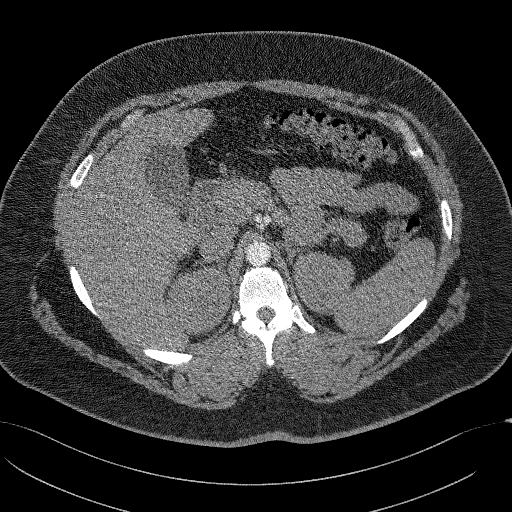
[im 57/284  lung]
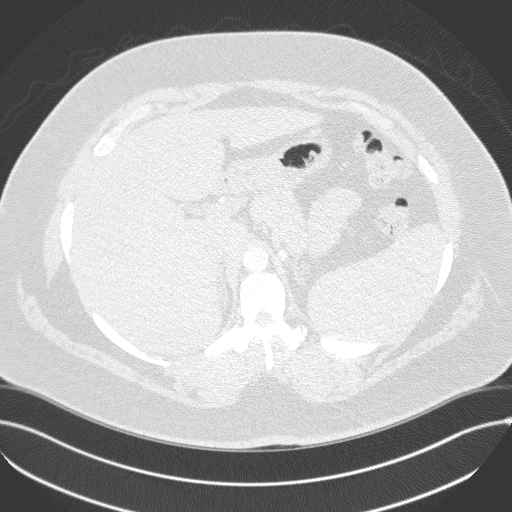
[im 76/284  soft-tissue]
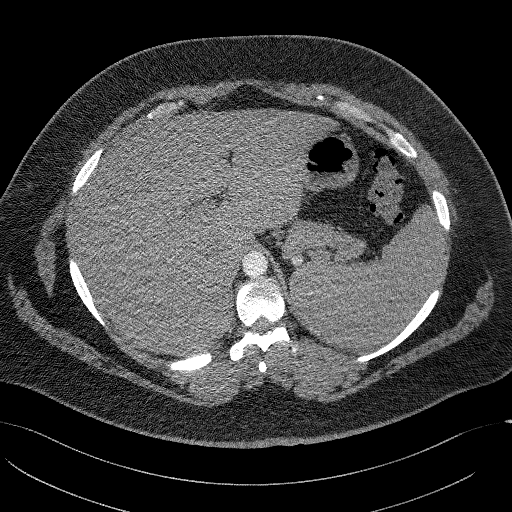
[im 95/284  lung]
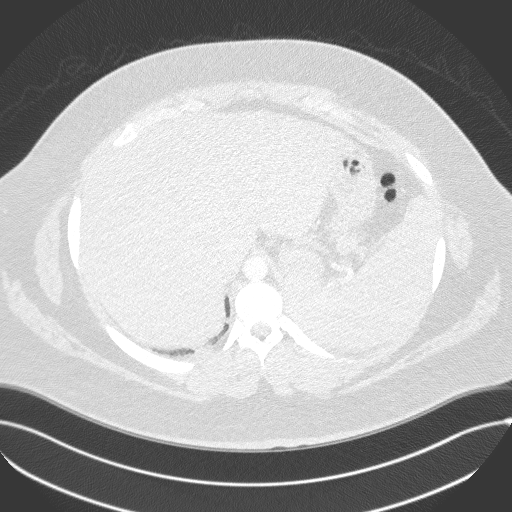
[im 114/284  soft-tissue]
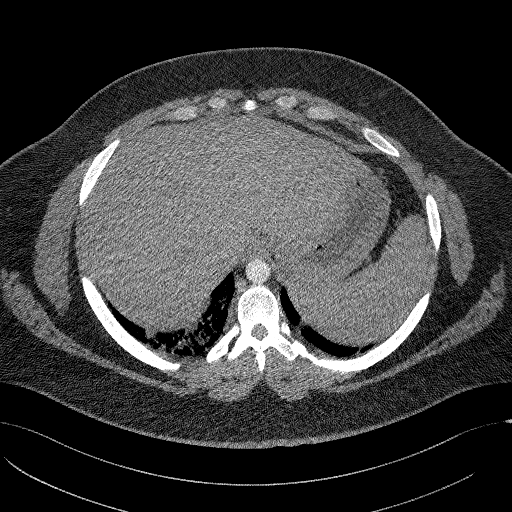
[im 133/284  lung]
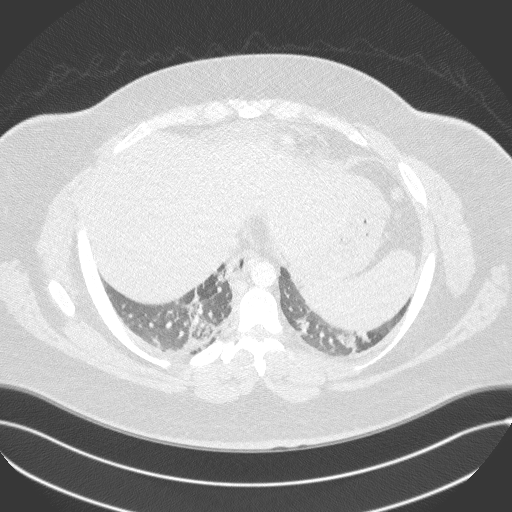
[im 151/284  soft-tissue]
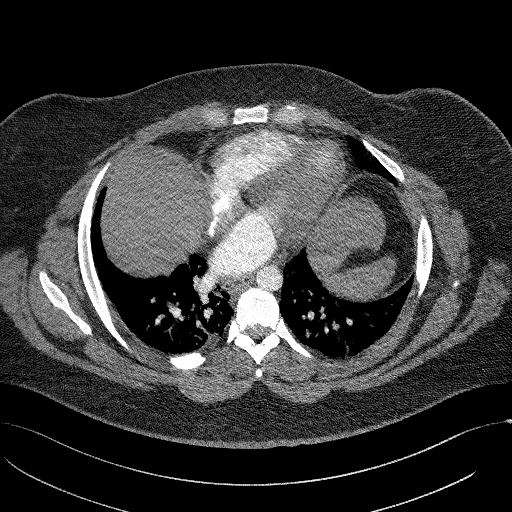
[im 170/284  lung]
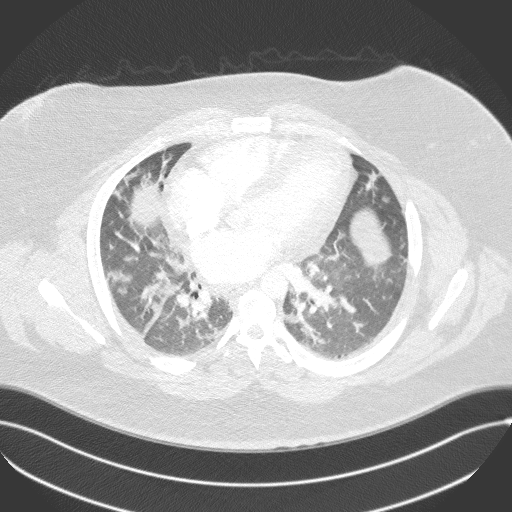
[im 189/284  soft-tissue]
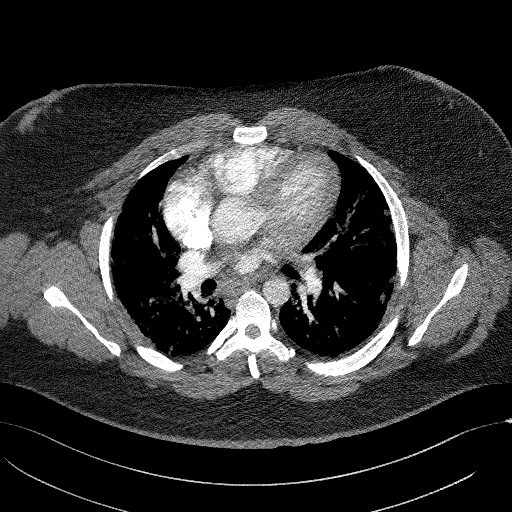
[im 208/284  lung]
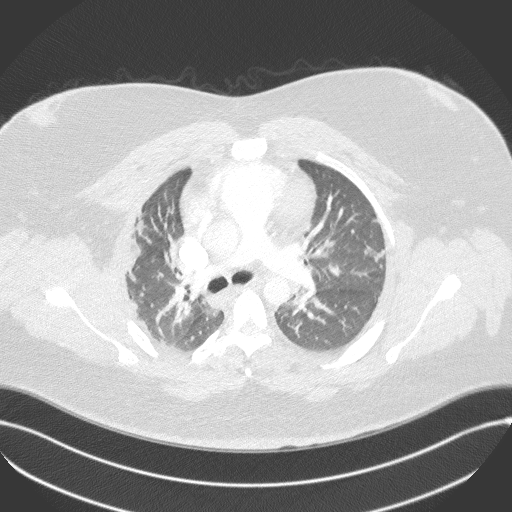
[im 227/284  soft-tissue]
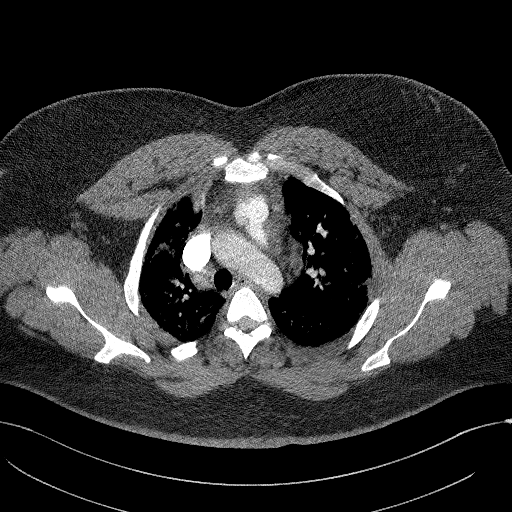
[im 246/284  lung]
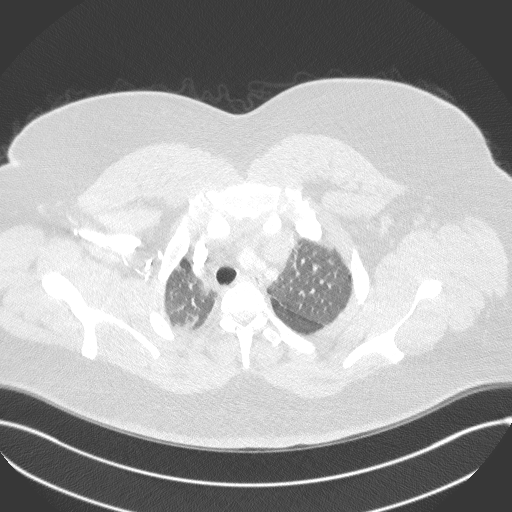
[im 265/284  soft-tissue]
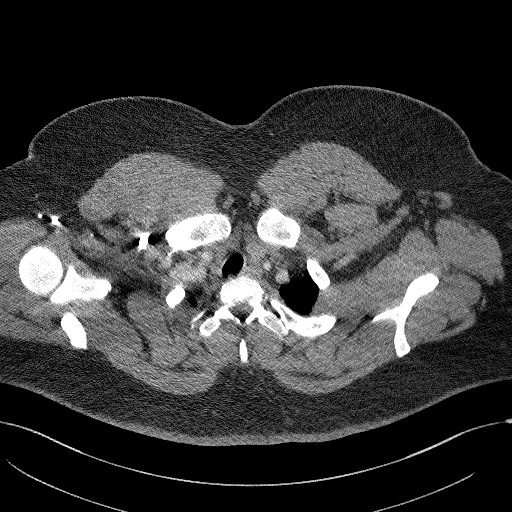

[14 of 36 positions shown; findings below may reference images not displayed]

FINDINGS: Cardiovascular: There is no demonstrable pulmonary embolus.
Previously noted pulmonary emboli not evident currently. There is no
thoracic aortic aneurysm or dissection. Visualized great vessels
appear unremarkable. Note that the right innominate and left common
carotid arteries arise as a common trunk. No pericardial effusion or
pericardial thickening evident.

Mediastinum/Nodes: Visualized thyroid appears normal. There are
multiple subcentimeter axillary and mediastinal lymph nodes. There
is no frank adenopathy by size criteria. There is a small hiatal
hernia.

Lungs/Pleura: There is patchy airspace opacity scattered throughout
the lungs bilaterally, significantly less than on the prior study.
The appearance is consistent with partial but incomplete resolution
of multifocal atypical organism pneumonia. No new foci of opacity
evident. No evident pleural effusions. Trachea and major bronchial
structures appear patent. No pneumothorax.

Upper Abdomen: Spleen measures 14.0 x 9.9 x 7.9 cm with a measured
splenic volume of 547 cubic cm. No splenic lesions evident.
Visualized upper abdominal structures otherwise appear normal.

Musculoskeletal: No blastic or lytic bone lesions. No evident chest
wall lesions.

Review of the MIP images confirms the above findings.
IMPRESSION: 1. Interval resolution of pulmonary emboli compared to previous
study. Currently no pulmonary emboli evident. No thoracic aortic
aneurysm or dissection.

2. There has been partial but incomplete clearing of multifocal
pneumonia bilaterally. Areas of patchy airspace opacity remains
throughout the lungs without significant consolidation. No pleural
effusion. No new opacities are evident.

3.  Splenomegaly.

4. Probable reactive subcentimeter lymph nodes. No frank adenopathy
by size criteria.

## 2021-12-08 ENCOUNTER — Other Ambulatory Visit (HOSPITAL_COMMUNITY): Payer: Self-pay | Admitting: Surgery

## 2021-12-23 ENCOUNTER — Encounter: Payer: Managed Care, Other (non HMO) | Attending: Surgery | Admitting: Dietician

## 2021-12-23 ENCOUNTER — Encounter: Payer: Self-pay | Admitting: Dietician

## 2021-12-23 VITALS — Ht 69.0 in | Wt 327.1 lb

## 2021-12-23 DIAGNOSIS — E119 Type 2 diabetes mellitus without complications: Secondary | ICD-10-CM | POA: Insufficient documentation

## 2021-12-23 DIAGNOSIS — E669 Obesity, unspecified: Secondary | ICD-10-CM | POA: Insufficient documentation

## 2021-12-23 NOTE — Progress Notes (Signed)
Nutrition Assessment for Bariatric Surgery Medical Nutrition Therapy Appt Start Time: 4:20    End Time: 5:35  Patient was seen on 12/23/2021 for Pre-Operative Nutrition Assessment. Letter of approval faxed to [facility] bariatric surgery program coordinator on 12/23/2021.   Referral stated Supervised Weight Loss (SWL) visits needed: 0  Pt completed visits.   Pt has cleared nutrition requirements.   Planned surgery: RYGB   NUTRITION ASSESSMENT   Anthropometrics  Start weight at [facility]: 327.1 lbs (date: 12/23/2021) Height: 69 in BMI: 48.30 kg/m2     Clinical  Medical hx: diabetes, hypertension, seizures, sleep apnea Medications: Metformin  Labs: pt self report A1c: 8.1 (July 2023) Notable signs/symptoms: none noted Any previous deficiencies? No  Micronutrient Nutrition Focused Physical Exam: Hair: No issues observed Eyes: No issues observed Mouth: No issues observed Neck: No issues observed Nails: No issues observed Skin: No issues observed  Lifestyle & Dietary Hx  Patient lives by himself. Mr. Patient performs the food shopping and prepares the meals. He reports that he typically skips or misses 7 out of 21 possible meals per week. He may have 11-12 meals per week that are take-out or at a restaurant.  Patient works as a Nutritional therapist. He denies binge eating though has felt shame and/or guilt after eating too much food.  He denies having used laxatives or vomiting to facilitate weight loss. He denies emotional eating during times of stress. He states that he knows the difference between hunger and thirst and can tell when he is full. Pt states his A1C in July was 8.1   Physical Activity: ADLs  Sleep Hygiene: duration and quality: good, about 6-7 hours a night  Current Patient Perceived Stress Level as stated by pt on a scale of 1-10:  3-4      Stress Management Techniques: fishing, gun range  Fruit servings per week: 2-3 Non starchy vegetable  servings per week: 3-4 Whole Grains per week: 1-2  24-Hr Dietary Recall First Meal: skip, water Snack:  Second Meal: Subway, rottisori chicken, cookie, diet coke Snack: caramel popcorn or peanuts Third Meal: chicken tenders with teriyaki sauce and green bean, diet dr. Malachi Bonds Snack:  Beverages: water, diet soda  Alcoholic beverages per week: 3   Estimated Energy Needs Calories: 2000   NUTRITION DIAGNOSIS  Overweight/obesity ([place]-3.3) related to past poor dietary habits and physical inactivity as evidenced by patient w/ planned RYGB surgery following dietary guidelines for continued weight loss.    NUTRITION INTERVENTION  Nutrition counseling (C-1) and education (E-2) to facilitate bariatric surgery goals.  Educated pt on micronutrient deficiencies post surgery and strategies to mitigate that risk   Pre-Op Goals Reviewed with the Patient Track food and beverage intake (pen and paper, MyFitness Pal, Baritastic app, etc.) Make healthy food choices while monitoring portion sizes Consume 3 meals per day or try to eat every 3-5 hours Avoid concentrated sugars and fried foods Keep sugar & fat in the single digits per serving on food labels Practice CHEWING your food (aim for applesauce consistency) Practice not drinking 15 minutes before, during, and 30 minutes after each meal and snack Avoid all carbonated beverages (ex: soda, sparkling beverages)  Limit caffeinated beverages (ex: coffee, tea, energy drinks) Avoid all sugar-sweetened beverages (ex: regular soda, sports drinks)  Avoid alcohol  Aim for 64-100 ounces of FLUID daily (with at least half of fluid intake being plain water)  Aim for at least 60-80 grams of PROTEIN daily Look for a liquid protein source that  contains ?15 g protein and ?5 g carbohydrate (ex: shakes, drinks, shots) Make a list of non-food related activities Physical activity is an important part of a healthy lifestyle so keep it moving! The goal is to reach  150 minutes of exercise per week, including cardiovascular and weight baring activity.  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    Next Steps  Pt has completed visits. No further supervised visits required/recomended  Patient is to follow up at [facility] for Pre-Op Class >2 weeks before surgery for further nutrition education.

## 2022-01-04 ENCOUNTER — Ambulatory Visit (HOSPITAL_COMMUNITY)
Admission: RE | Admit: 2022-01-04 | Discharge: 2022-01-04 | Disposition: A | Discharge status: Home / Self Care | Payer: Managed Care, Other (non HMO) | Source: Ambulatory Visit | Attending: Surgery | Admitting: Surgery

## 2022-01-04 ENCOUNTER — Other Ambulatory Visit: Payer: Self-pay

## 2022-10-31 ENCOUNTER — Encounter: Payer: Self-pay | Admitting: Dietician

## 2022-10-31 ENCOUNTER — Encounter: Payer: Managed Care, Other (non HMO) | Attending: Surgery | Admitting: Dietician

## 2022-10-31 VITALS — Ht 69.0 in | Wt 306.4 lb

## 2022-10-31 DIAGNOSIS — E669 Obesity, unspecified: Secondary | ICD-10-CM | POA: Diagnosis present

## 2022-10-31 DIAGNOSIS — E119 Type 2 diabetes mellitus without complications: Secondary | ICD-10-CM | POA: Insufficient documentation

## 2022-10-31 NOTE — Progress Notes (Signed)
Nutrition Assessment for Bariatric Surgery Medical Nutrition Therapy Appt Start Time:  1:26   End Time: 1:58  Patient was seen on 12/23/2021 for Pre-Operative Nutrition Assessment. Letter of approval faxed to [facility] bariatric surgery program coordinator on 12/23/2021.   Referral stated Supervised Weight Loss (SWL) visits needed: 0  Pt completed visits.   Pt has cleared nutrition requirements.   Planned surgery: RYGB   NUTRITION ASSESSMENT   Anthropometrics  Start weight at [facility]: 327.1 lbs (date: 12/23/2021) Height: 69 in Weight today: 306.4 BMI: 45.25 kg/m2     Clinical  Medical hx: diabetes, hypertension, seizures, sleep apnea Medications: Metformin Labs: pt self report A1c: 7.7 (Sept 2024 per patient) Notable signs/symptoms: none noted Any previous deficiencies? No  Micronutrient Nutrition Focused Physical Exam: Hair: No issues observed Eyes: No issues observed Mouth: No issues observed Neck: No issues observed Nails: No issues observed Skin: No issues observed  Lifestyle & Dietary Hx Pt states he does not skip meals now. Pt showing improvement through weight loss. Pt states he gets well over 64 oz fluids, mostly plain water, stating 6-7 bottles of water.  Physical Activity: ADLs  Sleep Hygiene: duration and quality: good, about 6-7 hours a night  Current Patient Perceived Stress Level as stated by pt on a scale of 1-10:  3-4      Stress Management Techniques: fishing, gun range  Fruit servings per week: 2-3 Non starchy vegetable servings per week: 3-4 Whole Grains per week: 1-2  24-Hr Dietary Recall First Meal: [place] bacon, boiled egg or oatmeal, [place] bacon Snack:  Second Meal: Subway, rottisori chicken Snack: caramel popcorn or peanuts Third Meal: chicken tenders with teriyaki sauce and green bean Snack:  Beverages: water  Alcoholic beverages per week: 0   Estimated Energy Needs Calories: 2000  NUTRITION DIAGNOSIS   Overweight/obesity ([place]-3.3) related to past poor dietary habits and physical inactivity as evidenced by patient w/ planned RYGB surgery following dietary guidelines for continued weight loss.  NUTRITION INTERVENTION  Nutrition counseling (C-1) and education (E-2) to facilitate bariatric surgery goals.  Educated pt on micronutrient deficiencies post surgery and strategies to mitigate that risk   Pre-Op Goals Reviewed with the Patient Track food and beverage intake (pen and paper, MyFitness Pal, Baritastic app, etc.) Make healthy food choices while monitoring portion sizes Consume 3 meals per day or try to eat every 3-5 hours Avoid concentrated sugars and fried foods Keep sugar & fat in the single digits per serving on food labels Practice CHEWING your food (aim for applesauce consistency) Practice not drinking 15 minutes before, during, and 30 minutes after each meal and snack Avoid all carbonated beverages (ex: soda, sparkling beverages)  Limit caffeinated beverages (ex: coffee, tea, energy drinks) Avoid all sugar-sweetened beverages (ex: regular soda, sports drinks)  Avoid alcohol  Aim for 64-100 ounces of FLUID daily (with at least half of fluid intake being plain water)  Aim for at least 60-80 grams of PROTEIN daily Look for a liquid protein source that contains >=15 g protein and <=5 g carbohydrate (ex: shakes, drinks, shots) Make a list of non-food related activities Physical activity is an important part of a healthy lifestyle so keep it moving! The goal is to reach 150 minutes of exercise per week, including cardiovascular and weight baring activity.  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    Next Steps  Pt has completed visits. No further supervised visits required/recomended  Patient is to follow up at [facility] for Pre-Op Class >2 weeks before surgery for further nutrition education.

## 2022-11-01 ENCOUNTER — Ambulatory Visit: Payer: Managed Care, Other (non HMO) | Admitting: Dietician

## 2022-11-07 ENCOUNTER — Encounter: Payer: Self-pay | Admitting: Dietician

## 2022-11-07 ENCOUNTER — Encounter: Payer: Managed Care, Other (non HMO) | Attending: Surgery | Admitting: Dietician

## 2022-11-07 VITALS — Ht 69.0 in | Wt 302.9 lb

## 2022-11-07 DIAGNOSIS — E119 Type 2 diabetes mellitus without complications: Secondary | ICD-10-CM

## 2022-11-07 DIAGNOSIS — Z713 Dietary counseling and surveillance: Secondary | ICD-10-CM | POA: Diagnosis not present

## 2022-11-07 DIAGNOSIS — E669 Obesity, unspecified: Secondary | ICD-10-CM

## 2022-11-07 DIAGNOSIS — Z6841 Body Mass Index (BMI) 40.0 and over, adult: Secondary | ICD-10-CM | POA: Insufficient documentation

## 2022-11-07 NOTE — Progress Notes (Signed)
Pre-Operative Nutrition Class:    Patient was seen on 11/07/2022 for Pre-Operative Bariatric Surgery [facility]    Surgery date: 12/13/2022 Surgery type: RYGB  Anthropometrics  Start weight at [facility]: 327.1 lbs (date: 12/23/2021) Height: 69 in Weight today: 302.9 lbs BMI: 44.73 kg/m2     Clinical  Medical hx: diabetes, hypertension, seizures, sleep apnea Medications: Metformin  Labs: pt self report A1c: 8.1 (July 2023) Notable signs/symptoms: none noted Any previous deficiencies? No  Samples given per MNT protocol. Patient educated on appropriate usage: [employer] Multivitamin Lot # [id number] Exp: 06/2024   Celebrate Vitamins Calcium  Lot # [id number] Exp: 01/2024   Ensure Max Protein Shake Lot # [id number] Exp: 10/04/2023  The following the learning objectives were met by the patient during this course: Identify Pre-Op Dietary Goals and will begin 2 weeks pre-operatively Identify appropriate sources of fluids and proteins  State protein recommendations and appropriate sources pre and post-operatively Identify Post-Operative Dietary Goals and will follow for 2 weeks post-operatively Identify appropriate multivitamin and calcium sources Describe the need for physical activity post-operatively and will follow MD recommendations State when to call healthcare provider regarding medication questions or post-operative complications When having a diagnosis of diabetes understanding hypoglycemia symptoms and the inclusion of 1 complex carbohydrate per meal  Handouts given during class include: Pre-Op Bariatric Surgery Diet Handout Protein Shake Handout Post-Op Bariatric Surgery Nutrition Handout [facility] Program Information Flyer Support Group Information Flyer [facility] Bariatric Supplements Price List  Follow-Up Plan: Patient will follow-up at [facility] 2 weeks post operatively for diet advancement per MD.

## 2022-11-24 ENCOUNTER — Ambulatory Visit: Payer: Self-pay | Admitting: Surgery

## 2022-12-08 NOTE — Patient Instructions (Addendum)
SURGICAL WAITING ROOM VISITATION Patients having surgery or a procedure may have no more than 2 support people in the waiting area - these visitors may rotate in the visitor waiting room.   Due to an increase in RSV and influenza rates and associated hospitalizations, children ages 42 and under may not visit patients in [facility] hospitals. If the patient needs to stay at the hospital during part of their recovery, the visitor guidelines for inpatient rooms apply.  PRE-OP VISITATION  Pre-op nurse will coordinate an appropriate time for 1 support person to accompany the patient in pre-op.  This support person may not rotate.  This visitor will be contacted when the time is appropriate for the visitor to come back in the pre-op area.  Please refer to the [facility] website for the visitor guidelines for Inpatients (after your surgery is over and you are in a regular room).  You are not required to quarantine at this time prior to your surgery. However, you must do this: Hand Hygiene often Do NOT share personal items Notify your provider if you are in close contact with someone who has COVID or you develop fever 100.4 or greater, new onset of sneezing, cough, sore throat, shortness of breath or body aches.  If you test positive for Covid or have been in contact with anyone that has tested positive in the last 10 days please notify you surgeon.    Your procedure is scheduled on:  Tuesday  December 13, 2022  Report to [facility] Main Entrance: Daniel entrance where the [employer] is available.   Report to admitting at: 11:15    AM  Call this number if you have any questions or problems the morning of surgery [phone]  Do not eat food after Midnight the night prior to your surgery/procedure.  After Midnight you may have the following liquids until  10:30 AM  DAY OF SURGERY  Clear Liquid Diet Water Black Coffee (sugar ok, NO MILK/CREAM OR CREAMERS)  Tea (sugar ok, NO  MILK/CREAM OR CREAMERS) regular and decaf                             Plain Jell-O  with no fruit (NO RED)                                           Fruit ices (not with fruit pulp, NO RED)                                     Popsicles (NO RED)                                                                  Juice: NO CITRUS JUICES: only apple, WHITE grape, WHITE cranberry Sports drinks like Gatorade or Powerade (NO RED)                   The day of surgery:  Drink ONE (1) Pre-Surgery G2 at   10;30     AM  the morning of surgery. Drink in one sitting. Do not sip.  This drink was given to you during your hospital pre-op appointment visit. Nothing else to drink after completing the Pre-Surgery  or G2 : No candy, chewing gum or throat lozenges.    FOLLOW ANY ADDITIONAL PRE OP INSTRUCTIONS YOU RECEIVED FROM YOUR SURGEON'S OFFICE!!!   Oral Hygiene is also important to reduce your risk of infection.        Remember - BRUSH YOUR TEETH THE MORNING OF SURGERY WITH YOUR REGULAR TOOTHPASTE  Do NOT smoke after Midnight the night before surgery.  STOP TAKING all Vitamins, Herbs and supplements 1 week before your surgery.   Take ONLY these medicines the morning of surgery with A SIP OF WATER: None                   You may not have any metal on your body including  jewelry, and body piercing  Do not wear lotions, powders,  cologne, or deodorant  Men may shave face and neck.  Contacts, Hearing Aids, dentures or bridgework may not be worn into surgery. DENTURES WILL BE REMOVED PRIOR TO SURGERY PLEASE DO NOT APPLY "Poly grip" OR ADHESIVES!!!  You may bring a small overnight bag with you on the day of surgery, only pack items that are not valuable. [facility] IS NOT RESPONSIBLE   FOR VALUABLES THAT ARE LOST OR STOLEN.   Do not bring your home medications to the hospital. The Pharmacy will dispense medications listed on your medication list to you during your admission in the Hospital.  Please  read over the following fact sheets you were given: IF YOU HAVE QUESTIONS ABOUT YOUR PRE-OP INSTRUCTIONS, PLEASE CALL [phone]   Rudean Haskell, RN   [facility] - Preparing for Surgery Before surgery, you can play an important role.  Because skin is not sterile, your skin needs to be as free of germs as possible.  You can reduce the number of germs on your skin by washing with CHG (chlorahexidine gluconate) soap before surgery.  CHG is an antiseptic cleaner which kills germs and bonds with the skin to continue killing germs even after washing. Please DO NOT use if you have an allergy to CHG or antibacterial soaps.  If your skin becomes reddened/irritated stop using the CHG and inform your nurse when you arrive at Short Stay. Do not shave (including legs and underarms) for at least 48 hours prior to the first CHG shower.  You may shave your face/neck.  Please follow these instructions carefully:  1.  Shower with CHG Soap the night before surgery and the  morning of surgery.  2.  If you choose to wash your hair, wash your hair first as usual with your normal  shampoo.  3.  After you shampoo, rinse your hair and body thoroughly to remove the shampoo.                             4.  Use CHG as you would any other liquid soap.  You can apply chg directly to the skin and wash.  Gently with a scrungie or clean washcloth.  5.  Apply the CHG Soap to your body ONLY FROM THE NECK DOWN.   Do not use on face/ open  Wound or open sores. Avoid contact with eyes, ears mouth and genitals (private parts).                       Wash face,  Genitals (private parts) with your normal soap.             6.  Wash thoroughly, paying special attention to the area where your  surgery  will be performed.  7.  Thoroughly rinse your body with warm water from the neck down.  8.  DO NOT shower/wash with your normal soap after using and rinsing off the CHG Soap.            9.  Pat yourself dry with a  clean towel.            10.  Wear clean pajamas.            11.  Place clean sheets on your bed the night of your first shower and do not  sleep with pets.  ON THE DAY OF SURGERY : Do not apply any lotions/deodorants the morning of surgery.  Please wear clean clothes to the hospital/surgery center.     FAILURE TO FOLLOW THESE INSTRUCTIONS MAY RESULT IN THE CANCELLATION OF YOUR SURGERY  PATIENT SIGNATURE_________________________________  NURSE SIGNATURE__________________________________  ________________________________________________________________________     Rogelia Mire    An incentive spirometer is a tool that can help keep your lungs clear and active. This tool measures how well you are filling your lungs with each breath. Taking long deep breaths may help reverse or decrease the chance of developing breathing (pulmonary) problems (especially infection) following: A long period of time when you are unable to move or be active. BEFORE THE PROCEDURE  If the spirometer includes an indicator to show your best effort, your nurse or respiratory therapist will set it to a desired goal. If possible, sit up straight or lean slightly forward. Try not to slouch. Hold the incentive spirometer in an upright position. INSTRUCTIONS FOR USE  Sit on the edge of your bed if possible, or sit up as far as you can in bed or on a chair. Hold the incentive spirometer in an upright position. Breathe out normally. Place the mouthpiece in your mouth and seal your lips tightly around it. Breathe in slowly and as deeply as possible, raising the piston or the ball toward the top of the column. Hold your breath for 3-5 seconds or for as long as possible. Allow the piston or ball to fall to the bottom of the column. Remove the mouthpiece from your mouth and breathe out normally. Rest for a few seconds and repeat Steps 1 through 7 at least 10 times every 1-2 hours when you are awake. Take your  time and take a few normal breaths between deep breaths. The spirometer may include an indicator to show your best effort. Use the indicator as a goal to work toward during each repetition. After each set of 10 deep breaths, practice coughing to be sure your lungs are clear. If you have an incision (the cut made at the time of surgery), support your incision when coughing by placing a pillow or rolled up towels firmly against it. Once you are able to get out of bed, walk around indoors and cough well. You may stop using the incentive spirometer when instructed by your caregiver.  RISKS AND COMPLICATIONS Take your time so you do not get dizzy or light-headed. If you are in  pain, you may need to take or ask for pain medication before doing incentive spirometry. It is harder to take a deep breath if you are having pain. AFTER USE Rest and breathe slowly and easily. It can be helpful to keep track of a log of your progress. Your caregiver can provide you with a simple table to help with this. If you are using the spirometer at home, follow these instructions: SEEK MEDICAL CARE IF:  You are having difficultly using the spirometer. You have trouble using the spirometer as often as instructed. Your pain medication is not giving enough relief while using the spirometer. You develop fever of 100.5 F (38.1 C) or higher.                                                                                                    SEEK IMMEDIATE MEDICAL CARE IF:  You cough up bloody sputum that had not been present before. You develop fever of 102 F (38.9 C) or greater. You develop worsening pain at or near the incision site. MAKE SURE YOU:  Understand these instructions. Will watch your condition. Will get help right away if you are not doing well or get worse. Document Released: 05/02/2006 Document Revised: 03/14/2011 Document Reviewed: 07/03/2006 [facility] Patient Information 2014 [place], [place].

## 2022-12-08 NOTE — Progress Notes (Addendum)
COVID Vaccine received:  []  No [x]  Yes Date of any COVID positive Test in last 90 days:  None  PCP - Carmel [place], NP [phone] (Work)  [phone] (Fax)  Cardiologist - none  Chest x-ray - 01-04-2022  2v  Epic EKG -  01-04-2022  Epic Stress Test -  ECHO - 01-13-2020 Cardiac Cath -   PCR screen: []  Ordered & Completed []   No Order but Needs PROFEND     [x]   N/A for this surgery  Surgery Plan:  []  Ambulatory   [x]  Outpatient in bed  []  Admit Anesthesia:    [x]  General  []  Spinal  []   Choice []   MAC  Bowel Prep - []  No  []   Yes __Bariatric diet   Pacemaker / ICD device [x]  No []  Yes   Spinal Cord Stimulator:[x]  No []  Yes       History of Sleep Apnea? []  No [x]  Yes   CPAP used?- [x]  No []  Yes  Doesn't use   Does the patient monitor blood sugar?   []  N/A   []  No [x]  Yes  Patient has: []  NO Hx DM   []  Pre-DM   []  DM1  [x]   DM2 Last A1c was: 7.7  on  09-2022    Does patient have a [employer] or Dexacom? [x]  No []  Yes   Fasting Blood Sugar Ranges-  Checks Blood Sugar _1  times a day Other Diabetic medications/ instructions: Metformin 1000mg  bid  Blood Thinner / Instructions:  none  (Eliquis d/c after 90 days post PE) Aspirin Instructions:  None  ERAS Protocol Ordered: []  No  [x]  Yes PRE-SURGERY []  ENSURE  [x]  G2    Patient is to be NPO after: 1030  Dental hx: []  Dentures:  [x]  N/A      []  Bridge or Partial:                   []  Loose or Damaged teeth:   Activity level: Patient is able to climb a flight of stairs without difficulty; [x]  No CP  [x]  No SOB.  Patient can perform ADLs without assistance.   Anesthesia review: HTN, Seizures d/t DKA spike ( Last:04-2016), hx DKA, hx PEs d/t Covid 2022, OSA-  CPAP, DM2,  BP was 155/88 and HR was 108 today at PST appt. His HR was elevated at his last couple of appts with Dr. Fredricka Bonine (10-14-2022 ? 118 and 109 on 12-07-2021). He states that he has no HA, dizziness, vision issues etc.  He was told to stop taking his BP meds (Lisinopril &  hydrochlorothiazide) by his previous PCP. He has an appt with his new PCP on Monday 12-12-22 and will discuss.    Patient denies shortness of breath, fever, cough and chest pain at PAT appointment.  Patient verbalized understanding and agreement to the Pre-Surgical Instructions that were given to them at this PAT appointment. Patient was also educated of the need to review these PAT instructions again prior to his surgery.I reviewed the appropriate phone numbers to call if they have any and questions or concerns.

## 2022-12-09 ENCOUNTER — Encounter (HOSPITAL_COMMUNITY): Payer: Self-pay

## 2022-12-09 ENCOUNTER — Other Ambulatory Visit: Payer: Self-pay

## 2022-12-09 ENCOUNTER — Encounter (HOSPITAL_COMMUNITY)
Admission: RE | Admit: 2022-12-09 | Discharge: 2022-12-09 | Disposition: A | Discharge status: Home / Self Care | Payer: Managed Care, Other (non HMO) | Source: Ambulatory Visit | Attending: Surgery

## 2022-12-09 VITALS — BP 155/88 | HR 108 | Temp 98.9°F | Resp 22 | Ht 70.0 in | Wt 299.0 lb

## 2022-12-09 DIAGNOSIS — Z86711 Personal history of pulmonary embolism: Secondary | ICD-10-CM | POA: Diagnosis not present

## 2022-12-09 DIAGNOSIS — E119 Type 2 diabetes mellitus without complications: Secondary | ICD-10-CM | POA: Insufficient documentation

## 2022-12-09 DIAGNOSIS — Z7984 Long term (current) use of oral hypoglycemic drugs: Secondary | ICD-10-CM | POA: Diagnosis not present

## 2022-12-09 DIAGNOSIS — Z01812 Encounter for preprocedural laboratory examination: Secondary | ICD-10-CM | POA: Diagnosis not present

## 2022-12-09 DIAGNOSIS — I10 Essential (primary) hypertension: Secondary | ICD-10-CM | POA: Diagnosis not present

## 2022-12-09 DIAGNOSIS — Z01818 Encounter for other preprocedural examination: Secondary | ICD-10-CM

## 2022-12-09 DIAGNOSIS — Z6841 Body Mass Index (BMI) 40.0 and over, adult: Secondary | ICD-10-CM | POA: Insufficient documentation

## 2022-12-09 HISTORY — DX: Gastro-esophageal reflux disease without esophagitis: K21.9

## 2022-12-09 LAB — COMPREHENSIVE METABOLIC PANEL
ALT: 15 U/L (ref 0–44)
AST: 12 U/L — ABNORMAL LOW (ref 15–41)
Albumin: 4.2 g/dL (ref 3.5–5.0)
Alkaline Phosphatase: 68 U/L (ref 38–126)
Anion gap: 11 (ref 5–15)
BUN: 13 mg/dL (ref 6–20)
CO2: 25 mmol/L (ref 22–32)
Calcium: 9.7 mg/dL (ref 8.9–10.3)
Chloride: 101 mmol/L (ref 98–111)
Creatinine, Ser: 0.72 mg/dL (ref 0.61–1.24)
GFR, Estimated: >60 mL/min (ref >60)
Glucose, Bld: 224 mg/dL — ABNORMAL HIGH (ref 70–99)
Potassium: 4.4 mmol/L (ref 3.5–5.1)
Sodium: 137 mmol/L (ref 135–145)
Total Bilirubin: 0.9 mg/dL (ref <1.2)
Total Protein: 7.9 g/dL (ref 6.5–8.1)

## 2022-12-09 LAB — CBC WITH DIFFERENTIAL/PLATELET
Abs Immature Granulocytes: 0.06 10*3/uL (ref 0.00–0.07)
Basophils Absolute: 0.1 10*3/uL (ref 0.0–0.1)
Basophils Relative: 1 %
Eosinophils Absolute: 0.1 10*3/uL (ref 0.0–0.5)
Eosinophils Relative: 1 %
HCT: 46.6 % (ref 39.0–52.0)
Hemoglobin: 15.8 g/dL (ref 13.0–17.0)
Immature Granulocytes: 1 %
Lymphocytes Relative: 26 %
Lymphs Abs: 2.6 10*3/uL (ref 0.7–4.0)
MCH: 29.2 pg (ref 26.0–34.0)
MCHC: 33.9 g/dL (ref 30.0–36.0)
MCV: 86 fL (ref 80.0–100.0)
Monocytes Absolute: 0.9 10*3/uL (ref 0.1–1.0)
Monocytes Relative: 9 %
Neutro Abs: 6.2 10*3/uL (ref 1.7–7.7)
Neutrophils Relative %: 62 %
Platelets: 254 10*3/uL (ref 150–400)
RBC: 5.42 MIL/uL (ref 4.22–5.81)
RDW: 12.6 % (ref 11.5–15.5)
WBC: 9.9 10*3/uL (ref 4.0–10.5)
nRBC: 0 % (ref 0.0–0.2)

## 2022-12-09 LAB — HEMOGLOBIN A1C
Hgb A1c MFr Bld: 8.7 % — ABNORMAL HIGH (ref 4.8–5.6)
Mean Plasma Glucose: 202.99 mg/dL

## 2022-12-09 LAB — GLUCOSE, CAPILLARY: Glucose-Capillary: 218 mg/dL — ABNORMAL HIGH (ref 70–99)

## 2022-12-12 ENCOUNTER — Encounter (HOSPITAL_COMMUNITY): Payer: Self-pay

## 2022-12-12 NOTE — Anesthesia Preprocedure Evaluation (Signed)
Anesthesia Evaluation  Patient identified by MRN, date of birth, ID band Patient awake    Reviewed: Allergy & Precautions, NPO status , Patient's Chart, lab work & pertinent test results  Airway Mallampati: III  TM Distance: >3 FB Neck ROM: Full    Dental  (+) Dental Advisory Given   Pulmonary sleep apnea    breath sounds clear to auscultation       Cardiovascular negative cardio ROS  Rhythm:Regular Rate:Normal     Neuro/Psych Seizures -,     GI/Hepatic Neg liver ROS,GERD  ,,  Endo/Other  diabetes, Type 2, Insulin Dependent, Oral Hypoglycemic Agents  Class 3 obesity  Renal/GU negative Renal ROS     Musculoskeletal   Abdominal   Peds  Hematology negative hematology ROS (+)   Anesthesia Other Findings   Reproductive/Obstetrics                             Anesthesia Physical Anesthesia Plan  ASA: 3  Anesthesia Plan: General   Post-op Pain Management: Gabapentin PO (pre-op)*, Tylenol PO (pre-op)* and Ketamine IV*   Induction: Intravenous  PONV Risk Score and Plan: 3 and Dexamethasone, Ondansetron, Midazolam and Scopolamine patch - Pre-op  Airway Management Planned: Oral ETT  Additional Equipment: None  Intra-op Plan:   Post-operative Plan: Extubation in OR  Informed Consent: I have reviewed the patients History and Physical, chart, labs and discussed the procedure including the risks, benefits and alternatives for the proposed anesthesia with the patient or authorized representative who has indicated his/her understanding and acceptance.     Dental advisory given  Plan Discussed with: CRNA  Anesthesia Plan Comments: ( )        Anesthesia Quick Evaluation

## 2022-12-12 NOTE — Progress Notes (Signed)
Case: [id number] Date/Time: 12/13/22 1315   Procedure: LAPAROSCOPIC ROUX-EN-Y GASTRIC BYPASS WITH UPPER ENDOSCOPY   Anesthesia type: General   Pre-op diagnosis: MORBID OBESITY   Location: [facility] ROOM 01 / [facility]   Surgeons: Berna Bue, MD       DISCUSSION: Tyler Morris is a 32 yo male who presents to PAT prior to surgery above. PMH of HTN, hx of PE, hx of seizure, DM, obesity (BMI 42).  Patient has hx of PE in the setting of COVID infection in 2022 requiring admission. He has completed course of anticoagulation  Patient also has hx of seizure in the setting of DKA in 2018. He was seen by neurology while inpatient and they recommended no AED tx at that time and that he would not need neurology f/u. He has not had recurrent seizures. He only takes Metformin for his DM. Last A1c was 8.7 on PAT labs. Results routed to Dr. Fredricka Bonine who previously mentioned if A1c is below 10 this is acceptable for surgery.  BP and HR elevated at PAT visit. He is seeing PCP on 12/9 for evaluation  VS: BP (!) 155/88 Comment: right arm sitting  Pulse (!) 108   Temp 37.2 C (Oral)   Resp (!) 22   Ht 5\' 10"  (1.778 m)   Wt 135.6 kg   SpO2 99%   BMI 42.90 kg/m   PROVIDERS: Carmel [place], NP   LABS: Labs reviewed: Acceptable for surgery. (all labs ordered are listed, but only abnormal results are displayed)  Labs Reviewed  HEMOGLOBIN A1C - Abnormal; Notable for the following components:      Result Value   Hgb A1c MFr Bld 8.7 (*)    All other components within normal limits  COMPREHENSIVE METABOLIC PANEL - Abnormal; Notable for the following components:   Glucose, Bld 224 (*)    AST 12 (*)    All other components within normal limits  GLUCOSE, CAPILLARY - Abnormal; Notable for the following components:   Glucose-Capillary 218 (*)    All other components within normal limits  CBC WITH DIFFERENTIAL/PLATELET  TYPE AND SCREEN     IMAGES:   EKG:   CV:  Echo 01/13/2020:  IMPRESSIONS    1.  Left ventricular ejection fraction, by estimation, is 70 to 75%. The left ventricle has hyperdynamic function. The left ventricle has no regional wall motion abnormalities. Left ventricular diastolic parameters were normal.  2. Right ventricular systolic function is normal. The right ventricular size is normal.  3. The mitral valve is normal in structure. Trivial mitral valve regurgitation. No evidence of mitral stenosis.  4. The aortic valve is tricuspid. Aortic valve regurgitation is not visualized. No aortic stenosis is present.  5. The inferior vena cava is normal in size with <50% respiratory variability, suggesting right atrial pressure of 8 mmHg.  Past Medical History:  Diagnosis Date   Diabetes mellitus without complication (HCC)    GERD (gastroesophageal reflux disease)    Pneumonia 01/04/2020   double pneumonia when he had COVID   Seizures (HCC) 04/2016   only once when he was in DKA,  None since    Past Surgical History:  Procedure Laterality Date   NO PAST SURGERIES      MEDICATIONS:  Insulin Pen Needle (PEN NEEDLES) 30G X 8 MM MISC   metFORMIN (GLUCOPHAGE) 1000 MG tablet   No current facility-administered medications for this encounter.    Marcille Blanco [facility] Short Stay/Anesthesiology [facility] Phone [phone] 12/12/2022  11:07 AM

## 2022-12-13 ENCOUNTER — Ambulatory Visit (HOSPITAL_COMMUNITY): Payer: Self-pay | Admitting: Medical

## 2022-12-13 ENCOUNTER — Other Ambulatory Visit: Payer: Self-pay

## 2022-12-13 ENCOUNTER — Ambulatory Visit (HOSPITAL_COMMUNITY)
Admission: RE | Admit: 2022-12-13 | Discharge: 2022-12-14 | Disposition: A | Discharge status: Home / Self Care | Payer: Managed Care, Other (non HMO) | Source: Ambulatory Visit | Attending: Surgery | Admitting: Surgery

## 2022-12-13 ENCOUNTER — Encounter (HOSPITAL_COMMUNITY): Payer: Self-pay | Admitting: Surgery

## 2022-12-13 ENCOUNTER — Ambulatory Visit (HOSPITAL_COMMUNITY): Payer: Managed Care, Other (non HMO) | Admitting: Anesthesiology

## 2022-12-13 ENCOUNTER — Encounter (HOSPITAL_COMMUNITY)
Admission: RE | Disposition: A | Discharge status: Home / Self Care | Payer: Self-pay | Source: Ambulatory Visit | Attending: Surgery

## 2022-12-13 DIAGNOSIS — R161 Splenomegaly, not elsewhere classified: Secondary | ICD-10-CM | POA: Insufficient documentation

## 2022-12-13 DIAGNOSIS — G4730 Sleep apnea, unspecified: Secondary | ICD-10-CM | POA: Insufficient documentation

## 2022-12-13 DIAGNOSIS — E119 Type 2 diabetes mellitus without complications: Secondary | ICD-10-CM | POA: Insufficient documentation

## 2022-12-13 DIAGNOSIS — Z6841 Body Mass Index (BMI) 40.0 and over, adult: Secondary | ICD-10-CM | POA: Insufficient documentation

## 2022-12-13 DIAGNOSIS — Z833 Family history of diabetes mellitus: Secondary | ICD-10-CM | POA: Diagnosis not present

## 2022-12-13 DIAGNOSIS — Z8249 Family history of ischemic heart disease and other diseases of the circulatory system: Secondary | ICD-10-CM | POA: Insufficient documentation

## 2022-12-13 DIAGNOSIS — Z7984 Long term (current) use of oral hypoglycemic drugs: Secondary | ICD-10-CM | POA: Diagnosis not present

## 2022-12-13 DIAGNOSIS — Z01818 Encounter for other preprocedural examination: Secondary | ICD-10-CM

## 2022-12-13 DIAGNOSIS — I10 Essential (primary) hypertension: Secondary | ICD-10-CM | POA: Insufficient documentation

## 2022-12-13 DIAGNOSIS — Z86711 Personal history of pulmonary embolism: Secondary | ICD-10-CM | POA: Diagnosis not present

## 2022-12-13 HISTORY — PX: GASTRIC ROUX-EN-Y: SHX5262

## 2022-12-13 LAB — TYPE AND SCREEN
ABO/RH(D): O POS
Antibody Screen: NEGATIVE

## 2022-12-13 LAB — GLUCOSE, CAPILLARY
Glucose-Capillary: 223 mg/dL — ABNORMAL HIGH (ref 70–99)
Glucose-Capillary: 208 mg/dL — ABNORMAL HIGH (ref 70–99)
Glucose-Capillary: 277 mg/dL — ABNORMAL HIGH (ref 70–99)
Glucose-Capillary: 273 mg/dL — ABNORMAL HIGH (ref 70–99)

## 2022-12-13 LAB — ABO/RH: ABO/RH(D): O POS

## 2022-12-13 SURGERY — LAPAROSCOPIC ROUX-EN-Y GASTRIC BYPASS WITH UPPER ENDOSCOPY
Anesthesia: General

## 2022-12-13 MED ORDER — FENTANYL CITRATE PF 50 MCG/ML IJ SOSY
25.0000 ug | PREFILLED_SYRINGE | INTRAMUSCULAR | Status: DC | PRN
Start: 2022-12-13 — End: 2022-12-13

## 2022-12-13 MED ORDER — DEXAMETHASONE SODIUM PHOSPHATE 10 MG/ML IJ SOLN
INTRAMUSCULAR | Status: AC
  Filled 2022-12-13: qty 1

## 2022-12-13 MED ORDER — ONDANSETRON HCL 4 MG/2ML IJ SOLN
INTRAMUSCULAR | Status: AC
  Filled 2022-12-13: qty 2

## 2022-12-13 MED ORDER — METOPROLOL TARTRATE 5 MG/5ML IV SOLN: 5.0000 mg | Freq: Four times a day (QID) | INTRAVENOUS | Status: DC | PRN

## 2022-12-13 MED ORDER — METOCLOPRAMIDE HCL 5 MG/ML IJ SOLN
10.0000 mg | Freq: Four times a day (QID) | INTRAMUSCULAR | Status: DC
  Administered 2022-12-13 – 2022-12-14 (×3): 10 mg via INTRAVENOUS
  Filled 2022-12-13 (×3): qty 2

## 2022-12-13 MED ORDER — ALBUMIN HUMAN 5 % IV SOLN: INTRAVENOUS | Status: DC | PRN

## 2022-12-13 MED ORDER — INSULIN ASPART 100 UNIT/ML IJ SOLN
0.0000 [IU] | INTRAMUSCULAR | Status: AC | PRN
  Administered 2022-12-13: 6 [IU] via SUBCUTANEOUS
  Administered 2022-12-13: 4 [IU] via SUBCUTANEOUS
  Filled 2022-12-13: qty 1

## 2022-12-13 MED ORDER — HEPARIN SODIUM (PORCINE) 5000 UNIT/ML IJ SOLN
5000.0000 [IU] | Freq: Three times a day (TID) | INTRAMUSCULAR | Status: DC
  Administered 2022-12-14: 5000 [IU] via SUBCUTANEOUS
  Filled 2022-12-13: qty 1

## 2022-12-13 MED ORDER — BUPIVACAINE LIPOSOME 1.3 % IJ SUSP: 20.0000 mL | Freq: Once | INTRAMUSCULAR | Status: DC

## 2022-12-13 MED ORDER — INSULIN ASPART 100 UNIT/ML IJ SOLN
0.0000 [IU] | INTRAMUSCULAR | Status: DC
  Administered 2022-12-14: 4 [IU] via SUBCUTANEOUS
  Administered 2022-12-14: 11 [IU] via SUBCUTANEOUS
  Administered 2022-12-14: 4 [IU] via SUBCUTANEOUS

## 2022-12-13 MED ORDER — BUPIVACAINE-EPINEPHRINE 0.25% -1:200000 IJ SOLN
INTRAMUSCULAR | Status: AC
Start: 2022-12-13 — End: ?
  Filled 2022-12-13: qty 1

## 2022-12-13 MED ORDER — 0.9 % SODIUM CHLORIDE (POUR BTL) OPTIME
TOPICAL | Status: DC | PRN
  Administered 2022-12-13: 1000 mL

## 2022-12-13 MED ORDER — PROPOFOL 10 MG/ML IV BOLUS
INTRAVENOUS | Status: AC
  Filled 2022-12-13: qty 20

## 2022-12-13 MED ORDER — LABETALOL HCL 5 MG/ML IV SOLN
10.0000 mg | INTRAVENOUS | Status: DC | PRN
  Administered 2022-12-13: 10 mg via INTRAVENOUS

## 2022-12-13 MED ORDER — TRAMADOL HCL 50 MG PO TABS
50.0000 mg | ORAL_TABLET | Freq: Four times a day (QID) | ORAL | Status: DC | PRN
  Administered 2022-12-13: 50 mg via ORAL
  Filled 2022-12-13 (×2): qty 1

## 2022-12-13 MED ORDER — ONDANSETRON HCL 4 MG/2ML IJ SOLN
4.0000 mg | INTRAMUSCULAR | Status: DC | PRN
Start: 2022-12-13 — End: 2022-12-14

## 2022-12-13 MED ORDER — ENSURE MAX PROTEIN PO LIQD
2.0000 [oz_av] | ORAL | Status: DC
Start: 2022-12-14 — End: 2022-12-14
  Administered 2022-12-14 (×2): 2 [oz_av] via ORAL

## 2022-12-13 MED ORDER — LABETALOL HCL 5 MG/ML IV SOLN
INTRAVENOUS | Status: AC
  Filled 2022-12-13: qty 4

## 2022-12-13 MED ORDER — ACETAMINOPHEN 160 MG/5ML PO SOLN
1000.0000 mg | Freq: Three times a day (TID) | ORAL | Status: DC
  Administered 2022-12-13 – 2022-12-14 (×2): 1000 mg via ORAL
  Filled 2022-12-13 (×2): qty 40.6

## 2022-12-13 MED ORDER — CHLORHEXIDINE GLUCONATE 4 % EX SOLN
60.0000 mL | Freq: Once | CUTANEOUS | Status: DC
Start: 2022-12-13 — End: 2022-12-13

## 2022-12-13 MED ORDER — LIDOCAINE 2% (20 MG/ML) 5 ML SYRINGE
INTRAMUSCULAR | Status: DC | PRN
  Administered 2022-12-13: 60 mg via INTRAVENOUS

## 2022-12-13 MED ORDER — METHOCARBAMOL 1000 MG/10ML IJ SOLN
500.0000 mg | Freq: Four times a day (QID) | INTRAMUSCULAR | Status: DC | PRN
  Filled 2022-12-13 (×2): qty 10

## 2022-12-13 MED ORDER — OXYCODONE HCL 5 MG/5ML PO SOLN
ORAL | Status: AC
  Administered 2022-12-13: 5 mg via ORAL
  Filled 2022-12-13: qty 5

## 2022-12-13 MED ORDER — INSULIN ASPART 100 UNIT/ML IJ SOLN
0.0000 [IU] | INTRAMUSCULAR | Status: DC
  Administered 2022-12-13 (×2): 8 [IU] via SUBCUTANEOUS

## 2022-12-13 MED ORDER — ACETAMINOPHEN 500 MG PO TABS
1000.0000 mg | ORAL_TABLET | ORAL | Status: AC
Start: 2022-12-13 — End: 2022-12-13
  Administered 2022-12-13: 1000 mg via ORAL
  Filled 2022-12-13: qty 2

## 2022-12-13 MED ORDER — MIDAZOLAM HCL 2 MG/2ML IJ SOLN
INTRAMUSCULAR | Status: AC
  Filled 2022-12-13: qty 2

## 2022-12-13 MED ORDER — ONDANSETRON HCL 4 MG/2ML IJ SOLN
INTRAMUSCULAR | Status: DC | PRN
  Administered 2022-12-13: 4 mg via INTRAVENOUS

## 2022-12-13 MED ORDER — FENTANYL CITRATE (PF) 100 MCG/2ML IJ SOLN
INTRAMUSCULAR | Status: AC
  Filled 2022-12-13: qty 2

## 2022-12-13 MED ORDER — HEPARIN SODIUM (PORCINE) 5000 UNIT/ML IJ SOLN
5000.0000 [IU] | INTRAMUSCULAR | Status: AC
  Administered 2022-12-13: 5000 [IU] via SUBCUTANEOUS
  Filled 2022-12-13: qty 1

## 2022-12-13 MED ORDER — AMISULPRIDE (ANTIEMETIC) 5 MG/2ML IV SOLN: 10.0000 mg | Freq: Once | INTRAVENOUS | Status: DC | PRN

## 2022-12-13 MED ORDER — SODIUM CHLORIDE 0.9 % IV SOLN: INTRAVENOUS | Status: DC

## 2022-12-13 MED ORDER — PHENYLEPHRINE 80 MCG/ML (10ML) SYRINGE FOR IV PUSH (FOR BLOOD PRESSURE SUPPORT)
PREFILLED_SYRINGE | INTRAVENOUS | Status: DC | PRN
  Administered 2022-12-13: 120 ug via INTRAVENOUS
  Administered 2022-12-13 (×3): 80 ug via INTRAVENOUS

## 2022-12-13 MED ORDER — LIDOCAINE HCL (PF) 2 % IJ SOLN
INTRAMUSCULAR | Status: AC
  Filled 2022-12-13: qty 5

## 2022-12-13 MED ORDER — BUPIVACAINE LIPOSOME 1.3 % IJ SUSP
INTRAMUSCULAR | Status: AC
  Filled 2022-12-13: qty 20

## 2022-12-13 MED ORDER — MIDAZOLAM HCL 5 MG/5ML IJ SOLN
INTRAMUSCULAR | Status: DC | PRN
  Administered 2022-12-13: 2 mg via INTRAVENOUS

## 2022-12-13 MED ORDER — OXYCODONE HCL 5 MG/5ML PO SOLN: 5.0000 mg | Freq: Four times a day (QID) | ORAL | Status: DC | PRN

## 2022-12-13 MED ORDER — APREPITANT 40 MG PO CAPS
40.0000 mg | ORAL_CAPSULE | ORAL | Status: AC
  Administered 2022-12-13: 40 mg via ORAL
  Filled 2022-12-13: qty 1

## 2022-12-13 MED ORDER — HYDRALAZINE HCL 20 MG/ML IJ SOLN: 10.0000 mg | INTRAMUSCULAR | Status: DC | PRN

## 2022-12-13 MED ORDER — SODIUM CHLORIDE 0.9 % IV SOLN
2.0000 g | INTRAVENOUS | Status: AC
Start: 2022-12-13 — End: 2022-12-13
  Administered 2022-12-13: 2 g via INTRAVENOUS
  Filled 2022-12-13: qty 2

## 2022-12-13 MED ORDER — GABAPENTIN 100 MG PO CAPS
100.0000 mg | ORAL_CAPSULE | Freq: Two times a day (BID) | ORAL | Status: DC
  Administered 2022-12-13 – 2022-12-14 (×2): 100 mg via ORAL
  Filled 2022-12-13 (×2): qty 1

## 2022-12-13 MED ORDER — PHENYLEPHRINE 80 MCG/ML (10ML) SYRINGE FOR IV PUSH (FOR BLOOD PRESSURE SUPPORT)
PREFILLED_SYRINGE | INTRAVENOUS | Status: AC
Start: 2022-12-13 — End: ?
  Filled 2022-12-13: qty 10

## 2022-12-13 MED ORDER — SCOPOLAMINE 1 MG/3DAYS TD PT72
1.0000 | MEDICATED_PATCH | TRANSDERMAL | Status: DC
  Administered 2022-12-13: 1.5 mg via TRANSDERMAL
  Filled 2022-12-13: qty 1

## 2022-12-13 MED ORDER — HYDROMORPHONE HCL 1 MG/ML IJ SOLN: 0.5000 mg | INTRAMUSCULAR | Status: DC | PRN

## 2022-12-13 MED ORDER — LACTATED RINGERS IV SOLN: INTRAVENOUS | Status: DC

## 2022-12-13 MED ORDER — GABAPENTIN 100 MG PO CAPS
100.0000 mg | ORAL_CAPSULE | ORAL | Status: AC
  Administered 2022-12-13: 100 mg via ORAL
  Filled 2022-12-13: qty 1

## 2022-12-13 MED ORDER — INSULIN ASPART 100 UNIT/ML IJ SOLN
INTRAMUSCULAR | Status: AC
  Filled 2022-12-13: qty 1

## 2022-12-13 MED ORDER — DEXMEDETOMIDINE HCL IN NACL 80 MCG/20ML IV SOLN
INTRAVENOUS | Status: DC | PRN
  Administered 2022-12-13: 8 ug via INTRAVENOUS
  Administered 2022-12-13 (×2): 4 ug via INTRAVENOUS

## 2022-12-13 MED ORDER — ROCURONIUM BROMIDE 10 MG/ML (PF) SYRINGE
PREFILLED_SYRINGE | INTRAVENOUS | Status: DC | PRN
  Administered 2022-12-13: 70 mg via INTRAVENOUS
  Administered 2022-12-13: 20 mg via INTRAVENOUS
  Administered 2022-12-13: 30 mg via INTRAVENOUS

## 2022-12-13 MED ORDER — PROPOFOL 10 MG/ML IV BOLUS
INTRAVENOUS | Status: DC | PRN
  Administered 2022-12-13: 200 mg via INTRAVENOUS

## 2022-12-13 MED ORDER — VISTASEAL 4 ML SINGLE DOSE KIT
4.0000 mL | PACK | Freq: Once | CUTANEOUS | Status: DC
  Filled 2022-12-13: qty 4

## 2022-12-13 MED ORDER — FENTANYL CITRATE (PF) 100 MCG/2ML IJ SOLN
INTRAMUSCULAR | Status: DC | PRN
  Administered 2022-12-13: 50 ug via INTRAVENOUS
  Administered 2022-12-13: 100 ug via INTRAVENOUS
  Administered 2022-12-13: 50 ug via INTRAVENOUS

## 2022-12-13 MED ORDER — BUPIVACAINE-EPINEPHRINE (PF) 0.25% -1:200000 IJ SOLN
INTRAMUSCULAR | Status: DC | PRN
  Administered 2022-12-13: 50 mL

## 2022-12-13 MED ORDER — CHLORHEXIDINE GLUCONATE 4 % EX SOLN: 60.0000 mL | Freq: Once | CUTANEOUS | Status: DC

## 2022-12-13 MED ORDER — ORAL CARE MOUTH RINSE
15.0000 mL | Freq: Once | OROMUCOSAL | Status: AC
Start: 2022-12-13 — End: 2022-12-13

## 2022-12-13 MED ORDER — SUGAMMADEX SODIUM 200 MG/2ML IV SOLN
INTRAVENOUS | Status: DC | PRN
  Administered 2022-12-13: 200 mg via INTRAVENOUS

## 2022-12-13 MED ORDER — FIBRIN SEALANT 2 ML SINGLE DOSE KIT
2.0000 mL | PACK | Freq: Once | CUTANEOUS | Status: DC
  Filled 2022-12-13: qty 2

## 2022-12-13 MED ORDER — STERILE WATER FOR IRRIGATION IR SOLN
Status: DC | PRN
  Administered 2022-12-13: 1000 mL

## 2022-12-13 MED ORDER — DOCUSATE SODIUM 100 MG PO CAPS
100.0000 mg | ORAL_CAPSULE | Freq: Two times a day (BID) | ORAL | Status: DC
  Administered 2022-12-13 – 2022-12-14 (×2): 100 mg via ORAL
  Filled 2022-12-13 (×2): qty 1

## 2022-12-13 MED ORDER — ROCURONIUM BROMIDE 10 MG/ML (PF) SYRINGE
PREFILLED_SYRINGE | INTRAVENOUS | Status: AC
  Filled 2022-12-13: qty 10

## 2022-12-13 MED ORDER — ACETAMINOPHEN 500 MG PO TABS
1000.0000 mg | ORAL_TABLET | Freq: Three times a day (TID) | ORAL | Status: DC
  Filled 2022-12-13: qty 2

## 2022-12-13 MED ORDER — DEXMEDETOMIDINE HCL IN NACL 80 MCG/20ML IV SOLN
INTRAVENOUS | Status: AC
Start: 2022-12-13 — End: ?
  Filled 2022-12-13: qty 20

## 2022-12-13 MED ORDER — CHLORHEXIDINE GLUCONATE 0.12 % MT SOLN
15.0000 mL | Freq: Once | OROMUCOSAL | Status: AC
  Administered 2022-12-13: 15 mL via OROMUCOSAL

## 2022-12-13 MED ORDER — DEXAMETHASONE SODIUM PHOSPHATE 4 MG/ML IJ SOLN
INTRAMUSCULAR | Status: DC | PRN
  Administered 2022-12-13: 4 mg via INTRAVENOUS

## 2022-12-13 MED ORDER — PANTOPRAZOLE SODIUM 40 MG IV SOLR
40.0000 mg | Freq: Every day | INTRAVENOUS | Status: DC
  Administered 2022-12-13: 40 mg via INTRAVENOUS
  Filled 2022-12-13: qty 10

## 2022-12-13 SURGICAL SUPPLY — 69 items
ANTIFOG SOL W/FOAM PAD STRL (MISCELLANEOUS) ×1
APPLICATOR COTTON TIP 6 STRL (MISCELLANEOUS) IMPLANT
APPLICATOR COTTON TIP 6IN STRL (MISCELLANEOUS)
APPLICATOR VISTASEAL 35 (MISCELLANEOUS) ×1 IMPLANT
APPLIER CLIP ROT 13.4 12 LRG (CLIP)
BAG COUNTER SPONGE SURGICOUNT (BAG) IMPLANT
BENZOIN TINCTURE PRP APPL 2/3 (GAUZE/BANDAGES/DRESSINGS) ×1 IMPLANT
BLADE SURG SZ11 CARB STEEL (BLADE) ×1 IMPLANT
BNDG ADH 1X3 SHEER STRL LF (GAUZE/BANDAGES/DRESSINGS) ×6 IMPLANT
CABLE HIGH FREQUENCY MONO STRZ (ELECTRODE) IMPLANT
CHLORAPREP W/TINT 26 (MISCELLANEOUS) ×2 IMPLANT
CLIP APPLIE ROT 13.4 12 LRG (CLIP) IMPLANT
CLIP SUT LAPRA TY ABSORB (SUTURE) ×2 IMPLANT
COVER SURGICAL LIGHT HANDLE (MISCELLANEOUS) ×1 IMPLANT
DEVICE SUT QUICK LOAD TK 5 (SUTURE) IMPLANT
DEVICE SUT TI-KNOT TK 5X26 (SUTURE) IMPLANT
DEVICE SUTURE ENDOST 10MM (ENDOMECHANICALS) ×1 IMPLANT
DRAIN PENROSE 0.25X18 (DRAIN) ×1 IMPLANT
ELECT REM PT RETURN 15FT ADLT (MISCELLANEOUS) ×1 IMPLANT
GAUZE 4X4 16PLY ~~LOC~~+RFID DBL (SPONGE) ×1 IMPLANT
GAUZE SPONGE 4X4 12PLY STRL (GAUZE/BANDAGES/DRESSINGS) IMPLANT
GLOVE BIO SURGEON STRL SZ 6 (GLOVE) ×1 IMPLANT
GLOVE INDICATOR 6.5 STRL GRN (GLOVE) ×1 IMPLANT
GOWN STRL REUS W/ TWL LRG LVL3 (GOWN DISPOSABLE) ×1 IMPLANT
GOWN STRL REUS W/ TWL XL LVL3 (GOWN DISPOSABLE) IMPLANT
IRRIG SUCT STRYKERFLOW 2 WTIP (MISCELLANEOUS) ×1
IRRIGATION SUCT STRKRFLW 2 WTP (MISCELLANEOUS) ×1 IMPLANT
KIT BASIN OR (CUSTOM PROCEDURE TRAY) ×1 IMPLANT
KIT GASTRIC LAVAGE 34FR ADT (SET/KITS/TRAYS/PACK) ×1 IMPLANT
KIT TURNOVER KIT A (KITS) IMPLANT
MARKER SKIN DUAL TIP RULER LAB (MISCELLANEOUS) ×1 IMPLANT
MAT PREVALON FULL STRYKER (MISCELLANEOUS) ×1 IMPLANT
NDL SPNL 22GX3.5 QUINCKE BK (NEEDLE) ×1 IMPLANT
NEEDLE SPNL 22GX3.5 QUINCKE BK (NEEDLE) ×1
PACK CARDIOVASCULAR III (CUSTOM PROCEDURE TRAY) ×1 IMPLANT
PENCIL SMOKE EVACUATOR (MISCELLANEOUS) IMPLANT
RELOAD STAPLE 60 2.6 WHT THN (STAPLE) ×2 IMPLANT
RELOAD STAPLE 60 3.6 BLU REG (STAPLE) ×2 IMPLANT
RELOAD STAPLE 60 3.8 GOLD REG (STAPLE) IMPLANT
RELOAD STAPLE 60 4.1 GRN THCK (STAPLE) IMPLANT
RELOAD SUT SNGL STCH ABSRB 2-0 (ENDOMECHANICALS) ×4 IMPLANT
RELOAD SUT SNGL STCH BLK 2-0 (ENDOMECHANICALS) ×4 IMPLANT
SCISSORS LAP 5X45 EPIX DISP (ENDOMECHANICALS) ×1 IMPLANT
SET TUBE SMOKE EVAC HIGH FLOW (TUBING) ×1 IMPLANT
SHEARS HARMONIC 45 ACE (MISCELLANEOUS) ×1 IMPLANT
SLEEVE ADV FIXATION 12X100MM (TROCAR) IMPLANT
SLEEVE ADV FIXATION 5X100MM (TROCAR) ×2 IMPLANT
SOLUTION ANTFG W/FOAM PAD STRL (MISCELLANEOUS) ×1 IMPLANT
STAPLER ECHELON BIOABSB 60 FLE (MISCELLANEOUS) IMPLANT
STAPLER ECHELON LONG 60 440 (INSTRUMENTS) ×1 IMPLANT
STAPLER RELOAD BLUE 60MM (STAPLE) ×7
STAPLER RELOAD GOLD 60MM (STAPLE)
STAPLER RELOAD GREEN 60MM (STAPLE)
STAPLER RELOAD WHITE 60MM (STAPLE) ×2
STRIP CLOSURE SKIN 1/2X4 (GAUZE/BANDAGES/DRESSINGS) ×1 IMPLANT
SUT MNCRL AB 4-0 PS2 18 (SUTURE) ×1 IMPLANT
SUT RELOAD ENDO STITCH 2 48X1 (ENDOMECHANICALS) ×4
SUT RELOAD ENDO STITCH 2.0 (ENDOMECHANICALS) ×4
SUT SURGIDAC NAB ES-9 0 48 120 (SUTURE) IMPLANT
SUT VIC AB 2-0 SH 27X BRD (SUTURE) ×1 IMPLANT
SYR 20ML ECCENTRIC (SYRINGE) ×1 IMPLANT
SYR 20ML LL LF (SYRINGE) ×1 IMPLANT
TOWEL OR 17X26 10 PK STRL BLUE (TOWEL DISPOSABLE) ×1 IMPLANT
TOWEL OR NON WOVEN STRL DISP B (DISPOSABLE) ×1 IMPLANT
TRAY FOLEY MTR SLVR 16FR STAT (SET/KITS/TRAYS/PACK) ×1 IMPLANT
TROCAR ADV FIXATION 12X100MM (TROCAR) ×1 IMPLANT
TROCAR ADV FIXATION 5X100MM (TROCAR) ×1 IMPLANT
TROCAR XCEL NON-BLD 5MMX100MML (ENDOMECHANICALS) ×1 IMPLANT
TUBING CONNECTING 10 (TUBING) IMPLANT

## 2022-12-13 NOTE — Transfer of Care (Signed)
Immediate Anesthesia Transfer of Care Note  Patient: Tyler Morris  Procedure(s) Performed: LAPAROSCOPIC ROUX-EN-Y GASTRIC BYPASS WITH UPPER ENDOSCOPY  Patient Location: PACU  Anesthesia Type:General  Level of Consciousness: awake and alert   Airway & Oxygen Therapy: Patient Spontanous Breathing and Patient connected to face mask oxygen  Post-op Assessment: Report given to RN and Post -op Vital signs reviewed and stable  Post vital signs: Reviewed and stable  Last Vitals:  Vitals Value Taken Time  BP 149/107 12/13/22 1606  Temp    Pulse 100 12/13/22 1607  Resp 8 12/13/22 1607  SpO2 100 % 12/13/22 1607  Vitals shown include unfiled device data.  Last Pain:  Vitals:   12/13/22 1137  TempSrc: Oral         Complications: No notable events documented.

## 2022-12-13 NOTE — H&P (Signed)
Tyler Morris [id number]   Referring Provider: Self  Subjective   Chief Complaint: FOLLOW UP VISIT (RETURN WEIGHT LOSS)   History of Present Illness: Patient returns to further discuss surgical treatment of morbid obesity. Upper GI 01/04/2022: Unremarkable, no hiatal hernia Chest x-ray 01/04/2022: No acute cardiopulmonary disease, unchanged subsegmental atelectasis/scarring. Dietitians-cleared after his visit in December 2023 Psychology-cleared [facility] He was noted to have hemoglobin A1c very high at 10.8, low vitamin D, and positive H. pylori on his labs. Treatment was ordered for the H. pylori and he was also recommended to start over-the-counter vitamin D3 supplement. Recommended to get his A1c below 10 before proceeding with surgery.  He has been working on the above and now returns to further discuss pursuing Roux-en-Y gastric bypass. He completed treatment for the H. pylori and did begin some vitamin D supplementation through his primary care doctor. His last hemoglobin A1c was 7.7 less than a month ago. He has made significant lifestyle changes including intermittent fasting, following an approximately 1500-calorie daily diet, decreasing junk food in his diet such as teriyaki chicken wings and fries, etc. He is actually lost 14 pounds since I saw him in December.  Initial visit 12/07/2021 : 32 year old with history of diabetes, hypertension, seizures, and provoked pulmonary emboli in the setting of COVID-19 pneumonia nearly 2 years ago (no longer on anticoagulation) who presents for consultation regarding surgical treatment of morbid obesity. He has been struggling with obesity for his entire life. He has a strong family history of hypertension and diabetes and also endorses less than ideal eating behavior patterns that he was brought up with. Recently he has lost about 7 pounds in the last 5 months just by cutting out [employer]. He has also tried other weight loss methods in the past including  the keto diet, he found most of this to be unsustainable. No specific exercise regimen. 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 Denies significant alcohol use, no tobacco use. His diabetes is well controlled with metformin. No previous abdominal surgeries.  Review of Systems: A complete review of systems was obtained from the patient. I have reviewed this information and discussed as appropriate with the patient. See HPI as well for other ROS.  Medical History: Past Medical History:  Diagnosis Date  Diabetes mellitus without complication (CMS/HHS-HCC)  Seizures (CMS/HHS-HCC)   There is no problem list on file for this patient.  History reviewed. No pertinent surgical history.   No Known Allergies  Current Outpatient Medications on File Prior to Visit  Medication Sig Dispense Refill  metFORMIN (GLUCOPHAGE) 1000 MG tablet Take 1,000 mg by mouth 2 (two) times daily   No current facility-administered medications on file prior to visit.   History reviewed. No pertinent family history.   Social History   Tobacco Use  Smoking Status Never  Smokeless Tobacco Never    Social History   Socioeconomic History  Marital status: Single  Tobacco Use  Smoking status: Never  Smokeless tobacco: Never  Vaping Use  Vaping status: Never Used  Substance and Sexual Activity  Alcohol use: Yes  Comment: 3 beers on Sunday  Drug use: Never   Objective:   Vitals:  10/14/22 1617  BP: (!) 138/95  Pulse: (!) 118  Temp: 36.8 C (98.2 F)  SpO2: 96%  Weight: (!) 138.8 kg (306 lb)  Height: 175.3 cm (5\' 9" )  PainSc: 0-No pain    Body mass index is 45.19 kg/m.  Alert and well-appearing Unlabored  respirations  Assessment and Plan:  Diagnoses and all orders for this visit:  Morbid obesity (CMS/HHS-HCC) Comments: BMI 30: 203lb  Type 2 diabetes mellitus without complication, unspecified whether long term insulin use (CMS/HHS-HCC)  Seizures  (CMS/HHS-HCC)  History of pulmonary embolism  Essential hypertension  Splenomegaly   He remains an excellent candidate for Roux-en-Y gastric bypass, and this is the procedure that he is most interested in. We again discussed the relevant anatomy and surgical technique. I went over the risks of surgery with him including bleeding, infection, pain, scarring, injury to intra-abdominal structures, staple line/anastomotic leak or intra-abdominal abscess, recurrent DVT/PE, pneumonia, stroke, heart attack, chronic abdominal pain or nausea, marginal ulcer, internal hernia, malnutrition/malabsorption, weight regain, etc. Discussed the typical peri-, and postoperative course. Discussed the importance of lifelong behavioral changes to combat the chronic and relapsing disease which is obesity. I am very impressed by the lifestyle modifications that he has made since I last saw him and he feels competent that he can continue on this path lifelong. Questions were welcomed and answered to his satisfaction. He is ready to proceed.   Kayleanna Lorman Carlye Grippe, MD

## 2022-12-13 NOTE — Op Note (Signed)
Preoperative diagnosis: Roux-en-Y gastric bypass  Postoperative diagnosis: Same   Procedure: Upper endoscopy   Surgeon: Feliciana Rossetti, M.D.  Anesthesia: Gen.   Indications for procedure: This patient was undergoing a Roux-en-Y gastric bypass.   Description of procedure: The endoscopy was placed in the mouth and into the oropharynx and under endoscopic vision it was advanced to the esophagogastric junction.  The stomach was insufflated and no bleeding or bubbles were seen.  The GEJ was identified at 39 cm from the teeth. The anastomosis was seen at 48 cm. It was widely patent and easily traversed. No bleeding or leaks were detected. The scope was withdrawn without difficulty.    Feliciana Rossetti, M.D. General, Bariatric, & Minimally Invasive Surgery [facility]

## 2022-12-13 NOTE — Progress Notes (Signed)
PHARMACY CONSULT FOR:  Risk Assessment for Post-Discharge VTE Following Bariatric Surgery  Procedure* laparoscopic Roux-en-Y gastric bypass (antecolic, antegastric), upper endoscopy   Sex M  Black race Y  Age (years) 32  BMI (kg/m2) 42.01  Operation duration (minutes) 107  History of VTE requiring treatment* Y  Hypercoagulable condition* N  Liver disorder* N  Pre-op venous stasis N  Pre-op functional health status Independent  Previous foregut or bariatric surg N  Post-op surgical site infection N  Transfusion intra- or post-op* N  Unplanned readmission N  Unplanned reoperation N  GI perforation/leak/obstruction* N  *specific risk factors for portomesenteric venous thrombosis   Predicted probability of 30-day post-discharge VTE:    2.1 % estimated using the [facility] Calculator   Recommendation for Discharge: Enoxaparin 40 mg Tyler Morris q12h x 4 weeks post-discharge    Tyler Morris is a 32 y.o. male who underwent laparoscopic Roux-en-Y gastric bypass (antecolic, antegastric), upper endoscopy on 12/13/2022.    Case start: 1408 Case end: 1555   No Known Allergies  Patient Measurements: Height: 5\' 10"  (177.8 cm) Weight: 132.8 kg (292 lb 12.8 oz) IBW/kg (Calculated) : 73 Body mass index is 42.01 kg/m.  No results for input(s): "WBC", "HGB", "HCT", "PLT", "APTT", "CREATININE", "LABCREA", "CREAT24HRUR", "MG", "PHOS", "ALBUMIN", "PROT", "AST", "ALT", "ALKPHOS", "BILITOT", "BILIDIR", "IBILI" in the last 72 hours. Estimated Creatinine Clearance: 185.1 mL/min (by C-G formula based on SCr of 0.72 mg/dL).    Past Medical History:  Diagnosis Date   Diabetes mellitus without complication (HCC)    GERD (gastroesophageal reflux disease)    Pneumonia 01/04/2020   double pneumonia when he had COVID   Seizures (HCC) 04/2016   only once when he was in DKA,  None since     Medications Prior to Admission  Medication Sig Dispense Refill Last Dose    metFORMIN (GLUCOPHAGE) 1000 MG tablet Take 1 tablet (1,000 mg total) by mouth 2 (two) times daily with a meal. 60 tablet 0 12/12/2022   Insulin Pen Needle (PEN NEEDLES) 30G X 8 MM MISC Use as directed 100 each 0        Tyler Morris M 12/13/2022,7:25 PM

## 2022-12-13 NOTE — Anesthesia Procedure Notes (Signed)
Procedure Name: Intubation Date/Time: 12/13/2022 1:49 PM  Performed by: Ludwig Lean, CRNAPre-anesthesia Checklist: Patient identified, Emergency Drugs available, Suction available and Patient being monitored Patient Re-evaluated:Patient Re-evaluated prior to induction Oxygen Delivery Method: Circle system utilized Preoxygenation: Pre-oxygenation with 100% oxygen Induction Type: IV induction Ventilation: Mask ventilation without difficulty Laryngoscope Size: Mac and 4 Grade View: Grade I Tube type: Oral Tube size: 7.5 mm Number of attempts: 1 Airway Equipment and Method: Stylet Placement Confirmation: ETT inserted through vocal cords under direct vision, positive ETCO2 and breath sounds checked- equal and bilateral Secured at: 23 cm Tube secured with: Tape Dental Injury: Teeth and Oropharynx as per pre-operative assessment

## 2022-12-13 NOTE — Op Note (Signed)
Operative Note  Tyler Morris  [id number]  [id number]  12/13/2022   Surgeon: Berna Bue MD FACS   Assistant: Feliciana Rossetti MD FACS   Procedure performed: laparoscopic Roux-en-Y gastric bypass (antecolic, antegastric) , upper endoscopy   Preop diagnosis: Morbid obesity Body mass index is 42.01 kg/m. Type II Diabetes. Hypertension. Splenomegaly.  Post-op diagnosis/intraop findings: same   Specimens: none Retained items: none  EBL: minimal cc Complications: none   Description of procedure: After confirming informed consent and administration of prophylactic heparin in holding, the patient was taken to the operating room and placed supine on operating room table where general endotracheal anesthesia was initiated, preoperative antibiotics were administered, SCDs applied, and a formal timeout was performed. The abdomen was prepped and draped in usual sterile fashion. Peritoneal access was gained using a Visiport technique in the left upper quadrant and insufflation to 15 mmHg ensued without issue. Gross inspection revealed no evidence of injury. Under direct visualization the remaining trochars were inserted. A laparoscopic assisted bilateral taps block was performed using Exparel mixed with Marcaine.  The omentum was reflected cephalad and the ligament of Treitz identified. The small bowel was followed to a point 50 cm distal to ligament of Treitz at which location the bowel was divided with a white load linear cutting stapler. A Penrose was sutured to the Roux side of the staple line for future identification. The bowel was measured another 100 cm distal to this and and the site for the jejunojejunostomy was aligned with the end of the biliopancreatic limb. Enterotomies were made with the Harmonic scalpel and the anastomosis was created with the 60 mm white load linear cutting stapler. The common enterotomy was closed with running 3-0 Vicryl starting on either end and tying centrally. The  mesenteric defect was closed with running silk suture secured with Lapra-Ty's. The anastomosis was inspected and appeared widely patent, hemostatic with no gaps in the suture line. Vistaseal was sprayed over the anastomosis.  We then divided the omentum using the harmonic scalpel. The patient was then placed in steep reverse Trendelenburg. The liver retractor was inserted through a subxiphoid incision and secured for fixed retraction of the left lobe. The Harmonic scalpel was used to enter the perigastric plane and the lesser sac at a point 6 cm distal to the GE junction on the lesser curve. The angle of His was gently bluntly dissected and the target shape of the pouch visualized to exclude any residual fundus. After confirming that all tubes have been removed from the stomach, the gastric pouch was created with serial fires of the linear cutting stapler. As we approached the angle of His, the Ewald tube was inserted to confirm no impingement on the GE junction.  The Roux limb with its attached Penrose drain was then identified and brought up to meet the gastric pouch ensuring no twist in the small bowel mesentery. The staple line of the small bowel is directed to the patient's left side. A running 3-0 Vicryl was used to create a posterior suture line for our anastomosis between the gastric pouch and the Roux limb. Gastrotomy and enterotomy was made with the Harmonic scalpel and a blue load linear cutting stapler was used to create a gastrojejunal anastomosis approximately 2.5cm wide. The common enterotomy was closed with running 3-0 Vicryl starting at either end and tying centrally. At this juncture the Ewald tube was passed through the gastrojejunal anastomosis. An anterior layer of running 3-0 Vicryl was used to complete the gastrojejunal anastomosis. The  ewald tube was removed without difficulty. The Adwolf space was closed with a figure-of-eight silk suture. At this point Dr. Sheliah Hatch performed an upper  endoscopy with the Roux limb gently clamped with a bowel clamp. Irrigation was instilled in the upper abdomen for a leak test. Please see his separate operative note- the anastomosis is noted to be patent, intact and hemostatic without any leak or bubbles present. The endoscope was removed and the abdomen once again surveyed. Additional vistaseal was sprayed over the gastrojejunal anastomosis. The liver retractor was removed under direct visualization. The abdomen was then desufflated and all remaining trochars removed. The skin incisions were closed with subcuticular 4-0 Monocryl; benzoin, Steri-Strips and Band-Aids were applied The patient was then awakened, extubated and taken to PACU in stable condition.     All counts were correct at the completion of the case.

## 2022-12-14 ENCOUNTER — Other Ambulatory Visit (HOSPITAL_COMMUNITY): Payer: Self-pay

## 2022-12-14 ENCOUNTER — Encounter (HOSPITAL_COMMUNITY): Payer: Self-pay | Admitting: Surgery

## 2022-12-14 ENCOUNTER — Other Ambulatory Visit: Payer: Self-pay

## 2022-12-14 ENCOUNTER — Telehealth (HOSPITAL_COMMUNITY): Payer: Self-pay | Admitting: Pharmacy Technician

## 2022-12-14 LAB — CBC
HCT: 38.2 % — ABNORMAL LOW (ref 39.0–52.0)
Hemoglobin: 13.1 g/dL (ref 13.0–17.0)
MCH: 29.8 pg (ref 26.0–34.0)
MCHC: 34.3 g/dL (ref 30.0–36.0)
MCV: 87 fL (ref 80.0–100.0)
Platelets: 218 10*3/uL (ref 150–400)
RBC: 4.39 MIL/uL (ref 4.22–5.81)
RDW: 12.6 % (ref 11.5–15.5)
WBC: 9.9 10*3/uL (ref 4.0–10.5)
nRBC: 0 % (ref 0.0–0.2)

## 2022-12-14 LAB — BASIC METABOLIC PANEL
Anion gap: 10 (ref 5–15)
BUN: 10 mg/dL (ref 6–20)
CO2: 21 mmol/L — ABNORMAL LOW (ref 22–32)
Calcium: 8.9 mg/dL (ref 8.9–10.3)
Chloride: 103 mmol/L (ref 98–111)
Creatinine, Ser: 0.65 mg/dL (ref 0.61–1.24)
GFR, Estimated: >60 mL/min (ref >60)
Glucose, Bld: 175 mg/dL — ABNORMAL HIGH (ref 70–99)
Potassium: 3.8 mmol/L (ref 3.5–5.1)
Sodium: 134 mmol/L — ABNORMAL LOW (ref 135–145)

## 2022-12-14 LAB — GLUCOSE, CAPILLARY
Glucose-Capillary: 187 mg/dL — ABNORMAL HIGH (ref 70–99)
Glucose-Capillary: 192 mg/dL — ABNORMAL HIGH (ref 70–99)
Glucose-Capillary: 251 mg/dL — ABNORMAL HIGH (ref 70–99)

## 2022-12-14 LAB — MAGNESIUM: Magnesium: 2 mg/dL (ref 1.7–2.4)

## 2022-12-14 MED ORDER — TRAMADOL HCL 50 MG PO TABS
50.0000 mg | ORAL_TABLET | Freq: Four times a day (QID) | ORAL | 0 refills | Status: AC | PRN
  Filled 2022-12-14: qty 10, 3d supply, fill #0

## 2022-12-14 MED ORDER — GABAPENTIN 100 MG PO CAPS
100.0000 mg | ORAL_CAPSULE | Freq: Two times a day (BID) | ORAL | 0 refills | Status: AC
  Filled 2022-12-14: qty 10, 5d supply, fill #0

## 2022-12-14 MED ORDER — ONDANSETRON 4 MG PO TBDP
4.0000 mg | ORAL_TABLET | Freq: Four times a day (QID) | ORAL | 0 refills | Status: AC | PRN
  Filled 2022-12-14: qty 20, 5d supply, fill #0

## 2022-12-14 MED ORDER — METFORMIN HCL 1000 MG PO TABS: 1000.0000 mg | ORAL_TABLET | Freq: Two times a day (BID) | ORAL | Status: AC

## 2022-12-14 MED ORDER — PANTOPRAZOLE SODIUM 40 MG PO TBEC
40.0000 mg | DELAYED_RELEASE_TABLET | Freq: Every day | ORAL | 0 refills | Status: AC
  Filled 2022-12-14: qty 90, 90d supply, fill #0

## 2022-12-14 MED ORDER — ACETAMINOPHEN 500 MG PO TABS: 1000.0000 mg | ORAL_TABLET | Freq: Three times a day (TID) | ORAL | Status: AC

## 2022-12-14 MED ORDER — ENOXAPARIN (LOVENOX) PATIENT EDUCATION KIT
PACK | Freq: Once | Status: AC
  Filled 2022-12-14: qty 1

## 2022-12-14 MED ORDER — ENOXAPARIN SODIUM 40 MG/0.4ML IJ SOSY
40.0000 mg | PREFILLED_SYRINGE | Freq: Two times a day (BID) | INTRAMUSCULAR | 0 refills | Status: AC
  Filled 2022-12-14: qty 22.4, 28d supply, fill #0

## 2022-12-14 NOTE — Progress Notes (Signed)
Patient discharged home, IV removed, discharge paperwork provided and explained, patient verbalized understanding, RN provided patient with Lovenox education kit, patient verbalized understanding regarding administration of Lovenox, [facility] outpatient pharmacist delivered patient's medications at the bedside prior to discharge.

## 2022-12-14 NOTE — Progress Notes (Signed)
Patient alert and oriented, pain is controlled. Patient is tolerating fluids, advanced to protein shake today, patient is tolerating well. Discussed QI "Goals for Discharge" document with patient including ambulation in halls, Incentive Spirometry use every hour, and oral care.  Also discussed pain and nausea control.  Enabled or verified head of bed 30 degree alarm activated.  BSTOP education provided including BSTOP information guide, "Guide for Pain Management after your Bariatric Procedure".  Diet progression education provided including "Bariatric Surgery Post-Op Food Plan Phase 1: Liquids".  Reviewed Gastric sleeve/bypass discharge instructions with patient and patient is able to articulate understanding. Provided information on BELT program, Support Group, BSTOP-D, and [facility] outpatient pharmacy. Communicated general update of patient status to surgeon. All questions answered. 24 hr fluid recall is 360 mL Per hydration protocol, bariatric nurse coordinator to make follow-up phone call within one week.    Thank you,  Lubertha Basque, RN, MSN Bariatric Nurse Coordinator [phone] (office)

## 2022-12-14 NOTE — Inpatient Diabetes Management (Signed)
Inpatient Diabetes Program Recommendations  AACE/ADA: New Consensus Statement on Inpatient Glycemic Control (2015)  Target Ranges:  Prepandial:   less than 140 mg/dL      Peak postprandial:   less than 180 mg/dL (1-2 hours)      Critically ill patients:  140 - 180 mg/dL   Lab Results  Component Value Date   GLUCAP 192 (H) 12/14/2022   HGBA1C 8.7 (H) 12/09/2022    Review of Glycemic Control  Diabetes history: DM2 Outpatient Diabetes medications: metformin 1000 mg BID Current orders for Inpatient glycemic control: Novolog 0-20 Q4H  CBGs 187, 192 HgbA1C - 8.7%  Inpatient Diabetes Program Recommendations:    Discharge recommendations:  Metformin 1000 mg BID  F/U with PCP regarding diabetes management.  Secure text with RN.  Thank you. Ailene Ards, RD, LDN, [facility] Inpatient Diabetes Coordinator [phone]

## 2022-12-14 NOTE — Progress Notes (Signed)
S: Uneventful night.  No nausea or significant pain.  Tolerating clear liquids and protein shakes without issue.  Walking in the halls.  O: Vitals, labs, intake/output, and orders reviewed at this time.  Afebrile, heart rate range 93-110 (preop heart rate 108);  hypertensive, saturating well on room air.  P.o. 360; uop 525 + 2 voids.  BMP unremarkable; white count 9.9 (preop); hemoglobin 13.1 (15.8 preop), platelets 218 (54). Cbg 187-277 with decreasing insulin requirements this morning  Meds: Tramadol x 1 yesterday evening, oxycodone x 1 yesterday afternoon; no as needed meds since about 815 last night.  Gen: A&Ox3, no distress  H&N: EOMI, atraumatic, neck supple Chest: unlabored respirations, RRR Abd: soft, nontender, nondistended, incision(s) c/d/i without cellulitis or hematoma.  Gauze dressings to periumbilical port site and left paramedian port site which appeared hemostatic without significant ecchymosis at this point. Ext: warm, no edema Neuro: grossly normal  Lines/tubes/drains: PIV  A/P: Postop day 1 status post laparoscopic Roux-en-Y gastric bypass, doing well -Continue clear liquids and protein shakes -Continue aggressive ambulation, pulmonary toilet, SCDs while in bed and chemical DVT prophylaxis -Decrease hemoglobin noted; suspect this is dilutional and no clinical concern currently for active bleeding or significant blood loss -Based on DVT risk merits discharge on 4 weeks of Lovenox -Anticipate discharge home this afternoon.   Phylliss Blakes, MD [facility], [place]

## 2022-12-14 NOTE — Telephone Encounter (Signed)
Patient Product/process development scientist completed.    The patient is insured through [employer]. Patient has [employer], may use a copay card, and/or apply for patient assistance if available.    Ran test claim for enoxaparin (Lovenox) 40 mg/0.4 ml  and the current 28 day co-pay is $5.00.   This test claim was processed through [facility]- copay amounts may vary at other pharmacies due to pharmacy/plan contracts, or as the patient moves through the different stages of their insurance plan.     Roland Earl, CPHT Pharmacy Technician III Certified Patient Advocate [facility] Patient Advocate Team Direct Number: [phone]  Fax: [phone]

## 2022-12-15 NOTE — Anesthesia Postprocedure Evaluation (Signed)
Anesthesia Post Note  Patient: Tyler Morris  Procedure(s) Performed: LAPAROSCOPIC ROUX-EN-Y GASTRIC BYPASS WITH UPPER ENDOSCOPY     Patient location during evaluation: PACU Anesthesia Type: General Level of consciousness: awake and alert Pain management: pain level controlled Vital Signs Assessment: post-procedure vital signs reviewed and stable Respiratory status: spontaneous breathing, nonlabored ventilation, respiratory function stable and patient connected to nasal cannula oxygen Cardiovascular status: blood pressure returned to baseline and stable Postop Assessment: no apparent nausea or vomiting Anesthetic complications: no   No notable events documented.  Last Vitals:  Vitals:   12/14/22 0540 12/14/22 0952  BP: (!) 134/90 (!) 143/102  Pulse: (!) 110 (!) 110  Resp: 18 16  Temp: 36.8 C   SpO2: 99% 97%    Last Pain:  Vitals:   12/14/22 0830  TempSrc:   PainSc: 2                  Kennieth Rad

## 2022-12-21 ENCOUNTER — Telehealth (HOSPITAL_COMMUNITY): Payer: Self-pay | Admitting: *Deleted

## 2022-12-21 NOTE — Telephone Encounter (Signed)
1. Tell me about your pain and pain management?     Pt denies any pain.     2. Let's talk about fluid intake. How much total fluid are you taking in?     Pt states that s/he working to meet goal of 64 oz of fluid today and doing well. Pt encouraged to continue to work towards meeting goal. Pt instructed to assess status and suggestions daily utilizing Hydration Action Plan on discharge folder and to call [facility] if in the "red zone".     3. How much protein have you taken in the last day?    Pt states he is meeting his goal of 80g of protein each day with the protein shakes    4. Have you had nausea? Tell me about when you have experienced nausea and what you did to help?   Pt denies nausea.   5. Has the frequency or color changed with your urine?   Pt did not report any changes in frequency or urgency.   6. Tell me what your incisions look like?   "Incisions look fine". Pt denies a fever, chills. Pt states incisions are not swollen, open, or draining. Pt encouraged to call [facility] if incisions change.     7. Have you been passing gas? BM?   Pt states that they are having BMs.   Pt states that they have had a BM. Pt instructed to take either Miralax or MoM as instructed per "Gastric Bypass/Sleeve Discharge Home Care Instructions". Pt to call surgeon's office if not able to have BM with medication.      8. If a problem or question were to arise who would you call? Do you know contact numbers for [facility], [facility], and [facility]?   Pt knows to call [facility] for surgical, [facility] for nutrition, and [facility] for non-urgent questions or concerns. Pt denies dehydration symptoms. Pt can describe s/sx of dehydration.   9. How has the walking going?   Pt states s/he is walking around and able to be active without difficulty.   10. Are you still using your incentive spirometer? If so, how often?  Pt encouraged to use incentive spirometer, at least 10x every hour while awake until s/he sees the surgeon.   11.  How are your vitamins and calcium going? How are you taking them?     Pt states that s/he is taking his/her supplements and vitamins without difficulty.    12. How has the anticoagulant Lovenox been going?   LOVENOX: Pt states that he is taking the Lovenox injections without difficulty. Reinforced education about taking injections q12h and rotating injection sites. Pt also instructed to monitor for unusual bruising and/or signs of bleeding.

## 2022-12-26 ENCOUNTER — Encounter: Payer: Self-pay | Admitting: Dietician

## 2022-12-26 ENCOUNTER — Encounter: Payer: Managed Care, Other (non HMO) | Attending: Surgery | Admitting: Dietician

## 2022-12-26 VITALS — Ht 69.0 in | Wt 287.2 lb

## 2022-12-26 DIAGNOSIS — E119 Type 2 diabetes mellitus without complications: Secondary | ICD-10-CM | POA: Insufficient documentation

## 2022-12-26 DIAGNOSIS — E669 Obesity, unspecified: Secondary | ICD-10-CM | POA: Insufficient documentation

## 2022-12-26 NOTE — Progress Notes (Signed)
2 Week Post-Operative Nutrition Class   Patient was seen on 12/26/2022 for Post-Operative Nutrition education at [facility].    Surgery date: 12/13/2022 Surgery type: RYGB  Anthropometrics  Start weight at [facility]: 327.1 lbs (date: 12/23/2021) Height: 69 in Weight today: 287.2 lbs   Clinical  Medical hx: diabetes, hypertension, seizures, sleep apnea Medications: Metformin  Labs: pt self report A1c: 8.1 (July 2023) Notable signs/symptoms: none noted Any previous deficiencies? No Bowel Habits: Every day to every other day no complaints   Body Composition Scale 12/26/2022  Current Body Weight 287.2  Total Body Fat % 35.4  Visceral Fat 24  Fat-Free Mass % 64.5   Total Body Water % 45.5  Muscle-Mass lbs 54.1  BMI 42.2  Body Fat Displacement          Torso  lbs 63.2         Left Leg  lbs 12.6         Right Leg  lbs 12.6         Left Arm  lbs 6.3         Right Arm  lbs 6.3     The following the learning objectives were met by the patient during this course: Identifies Soft Prepped Plan Advancement Guide  Identifies Soft, High Proteins (Phase 1), beginning 2 weeks post-operatively to 3 weeks post-operatively Identifies Additional Soft High Proteins, soft non-starchy vegetables, fruits and starches (Phase 2), beginning 3 weeks post-operatively to 3 months post-operatively Identifies appropriate sources of fluids, proteins, vegetables, fruits and starches Identifies appropriate fat sources and healthy verses unhealthy fat types   States protein, vegetable, fruit and starch recommendations and appropriate sources post-operatively Identifies the need for appropriate texture modifications, mastication, and bite sizes when consuming solids Identifies appropriate fat consumption and sources Identifies appropriate multivitamin and calcium sources post-operatively Describes the need for physical activity post-operatively and will follow MD  recommendations States when to call healthcare provider regarding medication questions or post-operative complications   Handouts given during class include: Soft Prepped Plan Advancement Guide   Follow-Up Plan: Patient will follow-up at [facility] in 10 weeks for 3 month post-op nutrition visit for diet advancement per MD.

## 2023-01-05 ENCOUNTER — Telehealth: Payer: Self-pay | Admitting: Dietician

## 2023-01-05 NOTE — Telephone Encounter (Signed)
 RD called pt to verify fluid intake once starting soft, solid proteins 2 week post-bariatric surgery.   Daily Fluid intake:  Daily Protein intake:  Bowel Habits:   Concerns/issues:   Left Voice Message with call back number

## 2023-03-13 ENCOUNTER — Ambulatory Visit: Payer: Managed Care, Other (non HMO) | Admitting: Dietician
